# Patient Record
Sex: Female | Born: 1950 | Race: Asian | Hispanic: No | Marital: Married | Smoking: Never smoker
Health system: Southern US, Community
[De-identification: ages and names within clinical notes are randomized; demographics above are authoritative.]

## PROBLEM LIST (undated history)

## (undated) DIAGNOSIS — R0602 Shortness of breath: Secondary | ICD-10-CM

## (undated) DIAGNOSIS — I10 Essential (primary) hypertension: Secondary | ICD-10-CM

---

## 1998-03-22 ENCOUNTER — Other Ambulatory Visit: Admission: RE | Admit: 1998-03-22 | Discharge: 1998-03-22 | Payer: Self-pay | Admitting: Dermatology

## 2011-11-01 ENCOUNTER — Inpatient Hospital Stay (HOSPITAL_COMMUNITY)
Admission: EM | Admit: 2011-11-01 | Discharge: 2011-11-05 | DRG: 066 | Disposition: A | Discharge status: Home / Self Care | Payer: Self-pay | Attending: Neurology | Admitting: Neurology

## 2011-11-01 ENCOUNTER — Other Ambulatory Visit: Payer: Self-pay

## 2011-11-01 ENCOUNTER — Emergency Department (HOSPITAL_COMMUNITY): Payer: Self-pay

## 2011-11-01 ENCOUNTER — Encounter: Payer: Self-pay | Admitting: Physical Medicine and Rehabilitation

## 2011-11-01 DIAGNOSIS — E119 Type 2 diabetes mellitus without complications: Secondary | ICD-10-CM | POA: Diagnosis present

## 2011-11-01 DIAGNOSIS — E785 Hyperlipidemia, unspecified: Secondary | ICD-10-CM | POA: Diagnosis present

## 2011-11-01 DIAGNOSIS — I10 Essential (primary) hypertension: Secondary | ICD-10-CM | POA: Diagnosis present

## 2011-11-01 DIAGNOSIS — Z9119 Patient's noncompliance with other medical treatment and regimen: Secondary | ICD-10-CM

## 2011-11-01 DIAGNOSIS — I619 Nontraumatic intracerebral hemorrhage, unspecified: Principal | ICD-10-CM | POA: Diagnosis present

## 2011-11-01 DIAGNOSIS — Z91199 Patient's noncompliance with other medical treatment and regimen due to unspecified reason: Secondary | ICD-10-CM

## 2011-11-01 HISTORY — DX: Essential (primary) hypertension: I10

## 2011-11-01 HISTORY — DX: Shortness of breath: R06.02

## 2011-11-01 LAB — POCT I-STAT, CHEM 8
BUN: 12 mg/dL (ref 6–23)
Calcium, Ion: 1.19 mmol/L (ref 1.12–1.32)
Creatinine, Ser: 0.7 mg/dL (ref 0.50–1.10)
TCO2: 29 mmol/L (ref 0–100)

## 2011-11-01 LAB — DIFFERENTIAL
Basophils Relative: 0 % (ref 0–1)
Lymphocytes Relative: 22 % (ref 12–46)
Monocytes Absolute: 0.9 10*3/uL (ref 0.1–1.0)
Monocytes Relative: 9 % (ref 3–12)
Neutro Abs: 7.4 10*3/uL (ref 1.7–7.7)

## 2011-11-01 LAB — COMPREHENSIVE METABOLIC PANEL
BUN: 12 mg/dL (ref 6–23)
CO2: 27 mEq/L (ref 19–32)
Chloride: 101 mEq/L (ref 96–112)
Creatinine, Ser: 0.62 mg/dL (ref 0.50–1.10)
GFR calc non Af Amer: >90 mL/min (ref >90)
Total Bilirubin: 0.3 mg/dL (ref 0.3–1.2)

## 2011-11-01 LAB — APTT: aPTT: 31 seconds (ref 24–37)

## 2011-11-01 LAB — URINALYSIS, ROUTINE W REFLEX MICROSCOPIC
Glucose, UA: 100 mg/dL — AB
Hgb urine dipstick: NEGATIVE
Leukocytes, UA: NEGATIVE
Nitrite: NEGATIVE
Urobilinogen, UA: 0.2 mg/dL (ref 0.0–1.0)

## 2011-11-01 LAB — CBC
HCT: 41 % (ref 36.0–46.0)
Hemoglobin: 14.2 g/dL (ref 12.0–15.0)
MCHC: 34.6 g/dL (ref 30.0–36.0)
MCV: 87.8 fL (ref 78.0–100.0)

## 2011-11-01 LAB — CK TOTAL AND CKMB (NOT AT ARMC)
CK, MB: 1.6 ng/mL (ref 0.3–4.0)
Total CK: 67 U/L (ref 7–177)

## 2011-11-01 LAB — MRSA PCR SCREENING: MRSA by PCR: NEGATIVE

## 2011-11-01 MED ORDER — ACETAMINOPHEN 325 MG PO TABS
650.0000 mg | ORAL_TABLET | Freq: Four times a day (QID) | ORAL | Status: DC | PRN
  Administered 2011-11-02 – 2011-11-05 (×2): 650 mg via ORAL
  Filled 2011-11-01 (×2): qty 2

## 2011-11-01 MED ORDER — NICARDIPINE HCL IN NACL 20-0.86 MG/200ML-% IV SOLN
5.0000 mg/h | INTRAVENOUS | Status: AC
  Administered 2011-11-01: 5 mg/h via INTRAVENOUS
  Filled 2011-11-01: qty 200

## 2011-11-01 MED ORDER — NICARDIPINE HCL IN NACL 20-0.86 MG/200ML-% IV SOLN
5.0000 mg/h | INTRAVENOUS | Status: DC
Start: 2011-11-01 — End: 2011-11-03
  Filled 2011-11-01 (×2): qty 200

## 2011-11-01 MED ORDER — LABETALOL HCL 5 MG/ML IV SOLN
INTRAVENOUS | Status: AC
  Filled 2011-11-01: qty 4

## 2011-11-01 NOTE — ED Notes (Signed)
Pt was brought in by family, pt was at church singing & the pt. Fell with L sided weakness & L sided facial droop, A&O x4 upon arrival, pt taken to CT scan from triage

## 2011-11-01 NOTE — ED Provider Notes (Signed)
History     CSN: [id number]  Arrival date & time 11/01/11  1235   First MD Initiated Contact with Patient 11/01/11 1243      Chief Complaint  Patient presents with  . Code Stroke    (Consider location/radiation/quality/duration/timing/severity/associated sxs/prior treatment) HPI Patient is a 60 year old female with history of diabetes and hypertension who was at church singing today when she suddenly collapsed. This occurred at 12 PM. Patient had a witnessed collapsed with subsequent left upper and lower extremity weakness. Church member who is also a friend and a Surveyor, mining witness the incident. He transported the patient by car to our emergency department. Upon arrival patient did have right-sided nasolabial fold flattening, left upper and lower limb ataxia, left upper and lower sensory deficits in the extremities. Patient was a code stroke that was called from triage. Initial blood pressure was 196/102. Initial fingerstick was 130. Patient was transported to scanner for further management. Past Medical History  Diagnosis Date  . Diabetes mellitus   . Hypertension     No past surgical history on file.  No family history on file.  History  Substance Use Topics  . Smoking status: Not on file  . Smokeless tobacco: Not on file  . Alcohol Use:     OB History    Grav Para Term Preterm Abortions TAB SAB Ect Mult Living                  Review of Systems  Unable to perform ROS: Other    Allergies  Review of patient's allergies indicates no known allergies.  Home Medications   Current Outpatient Rx  Name Route Sig Dispense Refill  . VITAMIN D-3 PO Oral Take 1 tablet by mouth daily.        BP 196/102  Pulse 82  Resp 22  SpO2 91%  Physical Exam  Nursing note and vitals reviewed. Constitutional: She appears well-developed and well-nourished. No distress.  HENT:  Head: Normocephalic and atraumatic.  Eyes: Conjunctivae and EOM are normal. Pupils are  equal, round, and reactive to light.  Neck: Normal range of motion.  Cardiovascular: Normal rate, regular rhythm, normal heart sounds and intact distal pulses.  Exam reveals no gallop and no friction rub.   No murmur heard. Pulmonary/Chest: Effort normal and breath sounds normal. No respiratory distress. She has no wheezes. She has no rales.  Abdominal: Soft. Bowel sounds are normal. She exhibits no distension. There is no tenderness. There is no rebound and no guarding.  Musculoskeletal: She exhibits no edema.  Neurological: She is alert.       Level of Consciousness (1a. ): Alert, keenly responsive (12/30 1310) LOC Questions (1b. ): Answers one question correctly (12/30 1310) LOC Commands (1c. ): Performs both tasks correctly (12/30 1310) Best Gaze (2. ): Normal (12/30 1310) Visual (3. ): No visual loss (12/30 1310) Facial Palsy (4. ): Minor paralysis (12/30 1310) Motor Arm, Left (5a. ): Drift (12/30 1310) Motor Arm, Right (5b. ): Drift (12/30 1310) Motor Leg, Left (6a. ): No drift (12/30 1310) Motor Leg, Right (6b. ): No drift (12/30 1310) Limb Ataxia (7. ): Present in one limb (12/30 1310) Sensory (8. ): Mild-to-moderate sensory loss, patient feels pinprick is less sharp or is dull on the affected side, or there is a loss of superficial pain with pinprick, but patient is aware of being touched (12/30 1310) Best Language (9. ): No aphasia (12/30 1310) Dysarthria (10. ): Normal (12/30 1310) Inattention/Extinction: Visual/tactile/auditory/spatial/personal inattention (  12/30 1310) Total: 7  (12/30 1310)   Skin: Skin is warm and dry. No rash noted.    ED Course  Procedures (including critical care time)  Date: 11/01/2011  Rate: 73  Rhythm: normal sinus rhythm  QRS Axis: normal  Intervals: normal  ST/T Wave abnormalities: normal  Conduction Disutrbances:none  Narrative Interpretation:   Old EKG Reviewed: none available   Labs Reviewed  CBC - Abnormal; Notable for the  following:    WBC 10.7 (*)    All other components within normal limits  COMPREHENSIVE METABOLIC PANEL - Abnormal; Notable for the following:    Potassium 3.3 (*)    Glucose, Bld 126 (*)    ALT 37 (*)    All other components within normal limits  GLUCOSE, CAPILLARY - Abnormal; Notable for the following:    Glucose-Capillary 130 (*)    All other components within normal limits  POCT I-STAT, CHEM 8 - Abnormal; Notable for the following:    Potassium 3.4 (*)    Glucose, Bld 129 (*)    All other components within normal limits  PROTIME-INR  APTT  DIFFERENTIAL  CK TOTAL AND CKMB  TROPONIN I  I-STAT, CHEM 8  URINALYSIS, ROUTINE W REFLEX MICROSCOPIC   Ct Head Wo Contrast  11/01/2011  *RADIOLOGY REPORT*  Clinical Data: Sudden onset of left-sided weakness.  Code stroke.  CT HEAD WITHOUT CONTRAST  Technique:  Contiguous axial images were obtained from the base of the skull through the vertex without contrast.  Comparison: None.  Findings: The brain stem and cerebellum appear normal.  An acute right thalamic hemorrhage measures 2.3 x 1.4 cm and is associated with mild adjacent vasogenic edema.  Part of this hemorrhage extends along the posterior limb of the internal capsule.  No significant effacement of the third ventricle or hydrocephalus.  No significant midline shift noted.  Mild hypodensity anterior to the hemorrhage could be due to adjacent lacunar infarct or base genic edema.  Periventricular and corona radiata white matter hypodensities are most compatible with chronic ischemic microvascular white matter disease.  No discrete mass lesion is observed.  IMPRESSION:  1.  Acute right thalamic hemorrhage with adjacent vasogenic edema. More confluent hypodensity anterior to this hemorrhage is also probably from vasogenic edema but could be due to an adjacent age indeterminate lacunar infarct.  Critical Value/emergent results were called by telephone to Dr. Earlie Lou (who verbally acknowledged these  results) at the time of interpretation on 11/01/2011 at 1:04 p.m.  Original Report Authenticated By: Dellia Cloud, M.D.   CRITICAL CARE Performed by: Cyndra Numbers   Total critical care time: 30 min  Critical care time was exclusive of separately billable procedures and treating other patients.  Critical care was necessary to treat or prevent imminent or life-threatening deterioration.  Critical care was time spent personally by Natassja on the following activities: development of treatment plan with patient and/or surrogate as well as nursing, discussions with consultants, evaluation of patient's response to treatment, examination of patient, obtaining history from patient or surrogate, ordering and performing treatments and interventions, ordering and review of laboratory studies, ordering and review of radiographic studies, pulse oximetry and re-evaluation of patient's condition.    1. CVA (cerebrovascular accident due to intracerebral hemorrhage)       MDM  Patient was evaluated by myself. She had a fingerstick that was 130. Patient was quickly checked and taken to scanner for evaluation. She had obvious left-sided weakness and sensory deficits. This was sudden in  onset last time seen normal at 12:00. Code stroke was called. However this was not paged correctly. This was quickly recognized and all of code stroke team was called. CT scan showed right thalamic intracerebral hemorrhage. Patient's blood pressure was already beginning to improve on its own. Nicardipine drip was ordered with plan to titrate between systolic of 130 and 150 systolic. Patient's exam remained stable while in the emergency department. Dr. Gershon Cull was present at bedside. Patient will be admitted to the neuro ICU for further management.  I did speak with patient's acquaintances who brought her in today regarding her immediate condition as well as her history.        Cyndra Numbers, MD 11/01/11 [phone]

## 2011-11-01 NOTE — ED Notes (Addendum)
Pt arrived to dept. @ 12:35 through triage, Code Stroke called at 12:38, pt last seen normal at 12:00 today, pt taken to CT at 12:46, neurology  & stroke team at bedside @ 12:44, CT read @12 :55. pt not candidate for TPA due to bleeding.

## 2011-11-01 NOTE — ED Notes (Signed)
Report given to 3100. Pt transported on cardiac monitor. Family at bedside.

## 2011-11-01 NOTE — ED Notes (Signed)
Code stroke cancelled per Dr. Gershon Cull.

## 2011-11-01 NOTE — ED Notes (Signed)
Pts watch, 2 earrings, grey colored necklace with grey cross, & yellow bracelet given to pts husband

## 2011-11-01 NOTE — ED Notes (Signed)
Pt brought to ED with physician friend who states he was sitting with the pt 30 minutes ago when she had sudden L sided weakness which remains now. Pt is unable to lift L arm or grip with L hand.

## 2011-11-01 NOTE — H&P (Signed)
Admission H&P    Chief Complaint: "left hemiparesis" HPI: Carol Alvarez is an 60 y.o. female who was in church singing, then stepped off the stage and developed left sided weakness with resultant fall to the floor. Brought in as CVA code with NIHSS of 7. CT head showed right subcortical hemorrhage, so no t-PA was given.  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   LSN: 12:00 am   tPA Given: No: hemorrhage  MRankin: 0  Past Medical History  Diagnosis Date  . Diabetes mellitus   . Hypertension     No past surgical history on file.  No family history on file. Social History:  does not have a smoking history on file. She does not have any smokeless tobacco history on file. Her alcohol and drug histories not on file.  Allergies: No Known Allergies  Meds: None  ROS: As above  Physical Examination: Blood pressure 196/102, pulse 82, resp. rate 22, SpO2 91.00%.  Neurologic Examination: MS: AAO*3, no aphasia, followed complex commands CN: EOMI, PERRL, VFF, mild left facial droop, tongue midline, V1-V3 decreased sensation on the left, no dysarthria, mild left drift, left proximal UE 5-/5, left distal UE 4+/5, right UE and LE - is 5/5. LLE proximal 4/5, LLE distal 5/5. Sensory: reduced sensation to pinprick in LUE and LLE. Coord: F to N intact b/l, Refelxes: 1+ in UE, trace KJ b/l, 0 ankle jerks b/l, mute plantars b/l, gait: deferred  Results for orders placed during the hospital encounter of 11/01/11 (from the past 48 hour(s))  PROTIME-INR     Status: Normal   Collection Time   11/01/11 12:41 PM      Component Value Range Comment   Prothrombin Time 12.6  11.6 - 15.2 (seconds)    INR 0.92  0.00 - 1.49    APTT     Status: Normal   Collection Time   11/01/11 12:41 PM      Component Value Range Comment   aPTT 31  24 - 37 (seconds)   CBC     Status: Abnormal   Collection Time   11/01/11 12:41 PM      Component Value Range  Comment   WBC 10.7 (*) 4.0 - 10.5 (K/uL)    RBC 4.67  3.87 - 5.11 (MIL/uL)    Hemoglobin 14.2  12.0 - 15.0 (g/dL)    HCT 16.1  09.6 - 04.5 (%)    MCV 87.8  78.0 - 100.0 (fL)    MCH 30.4  26.0 - 34.0 (pg)    MCHC 34.6  30.0 - 36.0 (g/dL)    RDW 40.9  81.1 - 91.4 (%)    Platelets 286  150 - 400 (K/uL)   DIFFERENTIAL     Status: Normal   Collection Time   11/01/11 12:41 PM      Component Value Range Comment   Neutrophils Relative 69  43 - 77 (%)    Neutro Abs 7.4  1.7 - 7.7 (K/uL)    Lymphocytes Relative 22  12 - 46 (%)    Lymphs Abs 2.3  0.7 - 4.0 (K/uL)    Monocytes Relative 9  3 - 12 (%)    Monocytes Absolute 0.9  0.1 - 1.0 (K/uL)    Eosinophils Relative 1  0 - 5 (%)    Eosinophils Absolute 0.1  0.0 - 0.7 (K/uL)    Basophils Relative 0  0 - 1 (%)  Basophils Absolute 0.0  0.0 - 0.1 (K/uL)   COMPREHENSIVE METABOLIC PANEL     Status: Abnormal   Collection Time   11/01/11 12:41 PM      Component Value Range Comment   Sodium 139  135 - 145 (mEq/L)    Potassium 3.3 (*) 3.5 - 5.1 (mEq/L)    Chloride 101  96 - 112 (mEq/L)    CO2 27  19 - 32 (mEq/L)    Glucose, Bld 126 (*) 70 - 99 (mg/dL)    BUN 12  6 - 23 (mg/dL)    Creatinine, Ser 1.61  0.50 - 1.10 (mg/dL)    Calcium 9.7  8.4 - 10.5 (mg/dL)    Total Protein 8.0  6.0 - 8.3 (g/dL)    Albumin 4.0  3.5 - 5.2 (g/dL)    AST 31  0 - 37 (U/L)    ALT 37 (*) 0 - 35 (U/L)    Alkaline Phosphatase 89  39 - 117 (U/L)    Total Bilirubin 0.3  0.3 - 1.2 (mg/dL)    GFR calc non Af Amer >90  >90 (mL/min)    GFR calc Af Amer >90  >90 (mL/min)   CK TOTAL AND CKMB     Status: Normal   Collection Time   11/01/11 12:41 PM      Component Value Range Comment   Total CK 67  7 - 177 (U/L)    CK, MB 1.6  0.3 - 4.0 (ng/mL)    Relative Index RELATIVE INDEX IS INVALID  0.0 - 2.5    TROPONIN I     Status: Normal   Collection Time   11/01/11 12:41 PM      Component Value Range Comment   Troponin I <0.30  <0.30 (ng/mL)   GLUCOSE, CAPILLARY      Status: Abnormal   Collection Time   11/01/11 12:41 PM      Component Value Range Comment   Glucose-Capillary 130 (*) 70 - 99 (mg/dL)   POCT I-STAT, CHEM 8     Status: Abnormal   Collection Time   11/01/11 12:49 PM      Component Value Range Comment   Sodium 143  135 - 145 (mEq/L)    Potassium 3.4 (*) 3.5 - 5.1 (mEq/L)    Chloride 104  96 - 112 (mEq/L)    BUN 12  6 - 23 (mg/dL)    Creatinine, Ser 0.96  0.50 - 1.10 (mg/dL)    Glucose, Bld 045 (*) 70 - 99 (mg/dL)    Calcium, Ion 4.09  1.12 - 1.32 (mmol/L)    TCO2 29  0 - 100 (mmol/L)    Hemoglobin 14.3  12.0 - 15.0 (g/dL)    HCT 81.1  91.4 - 78.2 (%)    Ct Head Wo Contrast  11/01/2011  *RADIOLOGY REPORT*  Clinical Data: Sudden onset of left-sided weakness.  Code stroke.  CT HEAD WITHOUT CONTRAST  Technique:  Contiguous axial images were obtained from the base of the skull through the vertex without contrast.  Comparison: None.  Findings: The brain stem and cerebellum appear normal.  An acute right thalamic hemorrhage measures 2.3 x 1.4 cm and is associated with mild adjacent vasogenic edema.  Part of this hemorrhage extends along the posterior limb of the internal capsule.  No significant effacement of the third ventricle or hydrocephalus.  No significant midline shift noted.  Mild hypodensity anterior to the hemorrhage could be due to adjacent lacunar infarct or base genic  edema.  Periventricular and corona radiata white matter hypodensities are most compatible with chronic ischemic microvascular white matter disease.  No discrete mass lesion is observed.  IMPRESSION:  1.  Acute right thalamic hemorrhage with adjacent vasogenic edema. More confluent hypodensity anterior to this hemorrhage is also probably from vasogenic edema but could be due to an adjacent age indeterminate lacunar infarct.  Critical Value/emergent results were called by telephone to Dr. Earlie Lou (who verbally acknowledged these results) at the time of interpretation on  11/01/2011 at 1:04 p.m.  Original Report Authenticated By: Dellia Cloud, M.D.   Assessment: 60 y.o. female who comes in with acute right subcortical hemorrhagic CVA  Stroke Risk Factors - diabetes mellitus, HTN  Plan: 1. HgbA1c, fasting lipid panel 2. MRI, MRA  of the brain without contrast 3. PT consult, OT consult, Speech consult 4. Echocardiogram 5. Carotid dopplers 6. Prophylactic therapy-None. Hemorrhagic CVA.  7. Risk factor modification 8. Heart healthy diet when passes nursing swallow screen 9. Bed rest 10. Sequential compression device 11. Nicardipine drip for goal SBP of 130 to 150 (closer to 130 is better)   LOS: 0 days   Carol Alvarez

## 2011-11-02 ENCOUNTER — Inpatient Hospital Stay (HOSPITAL_COMMUNITY): Payer: Self-pay

## 2011-11-02 LAB — LIPID PANEL
LDL Cholesterol: 132 mg/dL — ABNORMAL HIGH (ref 0–99)
Triglycerides: 249 mg/dL — ABNORMAL HIGH (ref <150)

## 2011-11-02 LAB — GLUCOSE, CAPILLARY

## 2011-11-02 LAB — HEMOGLOBIN A1C: Hgb A1c MFr Bld: 6.8 % — ABNORMAL HIGH (ref <5.7)

## 2011-11-02 MED ORDER — SIMVASTATIN 20 MG PO TABS
20.0000 mg | ORAL_TABLET | Freq: Every day | ORAL | Status: DC
  Administered 2011-11-02 – 2011-11-05 (×4): 20 mg via ORAL
  Filled 2011-11-02 (×4): qty 1

## 2011-11-02 MED ORDER — LISINOPRIL 5 MG PO TABS
5.0000 mg | ORAL_TABLET | Freq: Every day | ORAL | Status: DC
  Administered 2011-11-02 – 2011-11-05 (×4): 5 mg via ORAL
  Filled 2011-11-02 (×4): qty 1

## 2011-11-02 NOTE — Progress Notes (Signed)
Physical Therapy Evaluation Patient Details Name: Carol Alvarez MRN: [id number] DOB: Sep 10, 1951 Today's Date: 11/02/2011  Problem List: There is no problem list on file for this patient.   Past Medical History:  Past Medical History  Diagnosis Date  . Diabetes mellitus   . Hypertension   . Shortness of breath    Past Surgical History: No past surgical history on file.  PT Assessment/Plan/Recommendation PT Assessment Clinical Impression Statement: Pt s/p Rt basal ganglia hemorrhagic CVA with resultant Lt sided weakness and decr balance.  Pt with decr awareness of deficits and will benefit from continued therapies to incr safety and Independence prior to d/c home with husband's assist. PT Recommendation/Assessment: Patient will need skilled PT in the acute care venue PT Problem List: Decreased strength;Decreased balance;Decreased mobility;Decreased cognition;Decreased knowledge of use of DME;Decreased safety awareness Barriers to Discharge: None PT Therapy Diagnosis : Difficulty walking;Hemiplegia non-dominant side PT Plan PT Frequency: Min 4X/week PT Treatment/Interventions: DME instruction;Gait training;Functional mobility training;Therapeutic activities;Balance training;Neuromuscular re-education;Patient/family education PT Recommendation Recommendations for Other Services: Rehab consult Follow Up Recommendations: Inpatient Rehab Equipment Recommended: Defer to next venue PT Goals  Acute Rehab PT Goals PT Goal Formulation: With patient Time For Goal Achievement: 7 days Pt will Roll Supine to Right Side: with supervision PT Goal: Rolling Supine to Right Side - Progress: Not met Pt will go Supine/Side to Sit: with supervision;with HOB 0 degrees PT Goal: Supine/Side to Sit - Progress: Not met Pt will Sit at [facility] of Bed: with supervision;3-5 min;with no upper extremity support PT Goal: Sit at Edge Of Bed - Progress: Not met Pt will go Sit to Supine/Side: with supervision;with HOB 0  degrees PT Goal: Sit to Supine/Side - Progress: Not met Pt will go Sit to Stand: with supervision PT Goal: Sit to Stand - Progress: Not met Pt will go Stand to Sit: with supervision PT Goal: Stand to Sit - Progress: Not met Pt will Stand: with supervision;3 - 5 min;with no upper extremity support PT Goal: Stand - Progress: Not met Pt will Ambulate: 51 - 150 feet;with supervision;with least restrictive assistive device PT Goal: Ambulate - Progress: Not met  PT Evaluation Precautions/Restrictions  Precautions Precautions: Fall Required Braces or Orthoses: No Restrictions Weight Bearing Restrictions: No Prior Functioning  Home Living Lives With: Spouse (can be there 24 hr) Receives Help From: Family Type of Home: House Home Layout: One level Home Access: Level entry Bathroom Shower/Tub: Walk-in shower;Door [employer] Toilet: Standard Home Adaptive Equipment: Hand-held shower hose;Other (comment) (vanity beside toilet) Prior Function Level of Independence: Independent with basic ADLs;Independent with homemaking with ambulation Driving: Yes 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 Cognition Cognition Arousal/Alertness: Awake/alert Overall Cognitive Status: Impaired Orientation Level: Oriented X4 Awareness of Errors: Decreased awareness of errors made Decreased Awareness of Errors: Assistance required to identify errors made;Assistance required to correct errors made Awareness of Errors - Other Comments: leans to Lt with decr awareness;  Awareness of Deficits: Decreased awareness of deficits Awareness of Deficits - Other Comments: inattention of Lt side Sensation/Coordination Sensation Light Touch: Not tested Coordination Gross Motor Movements are Fluid and Coordinated: No Fine Motor Movements are Fluid and Coordinated: No (slowness of opposition with LUE) Coordination and Movement Description: Slow and delibrate with LUE compared to RUE Finger Nose Finger  Test: Slow and delibrate with LUE compared to RUE Extremity Assessment RUE Assessment RUE Assessment: Within Functional Limits LUE Assessment LUE Assessment: Exceptions to [facility] LUE Strength LUE Overall Strength: Deficits LUE Overall Strength Comments: Pt with slowness  of movements with LUE and not consistently attending to it as well. RLE Assessment RLE Assessment: Within Functional Limits LLE Assessment LLE Assessment: Exceptions to WFL LLE AROM (degrees) Overall AROM Left Lower Extremity: Within functional limits for tasks assessed LLE Strength LLE Overall Strength: Deficits LLE Overall Strength Comments: poor advancement of LLE with initial gait (decr hip flexion, dorsiflexion); improved with practice Mobility (including Balance) Bed Mobility Bed Mobility: Yes Rolling Right: 4: Min assist Rolling Right Details (indicate cue type and reason): vc to bend LLE and reach with LUE to initiate rolling; LUE fell behind her torso as she began to roll with pt unaware; assist required to rotate torso/shoulders to continue onto her side Rolling Left: 5: Supervision Rolling Left Details (indicate cue type and reason): vc for technique and sequencing Right Sidelying to Sit: Other (comment);HOB flat (min-guard assist; visual & vc for sequence) Sitting - Scoot to Edge of Bed: Other (comment) (min-guard assist for safety due to decr balance) Transfers Sit to Stand: 3: Mod assist;Without upper extremity assist;From bed Sit to Stand Details (indicate cue type and reason): leans to Lt with decr awareness; able to initiate correction with verbal and visual cues Stand to Sit: 4: Min assist;With upper extremity assist;To chair/3-in-1 Stand to Sit Details: visual and verbal cues for technique (alignment with chair, use of armrests) Ambulation/Gait Ambulation/Gait: Yes Ambulation/Gait Assistance: 3: Mod assist Ambulation/Gait Assistance Details (indicate cue type and reason): Pt leans to Lt with decr  awareness; narrow base of support with LLE nearly crossing midline on occasion;  Ambulation Distance (Feet): 30 Feet Assistive device: 1 person hand held assist Gait Pattern: Step-through pattern;Decreased stance time - left;Decreased step length - left;Decreased weight shift to right  Posture/Postural Control Posture/Postural Control: Postural limitations Postural Limitations: Leans to Lt with decr awareness Balance Balance Assessed: Yes Static Sitting Balance Static Sitting - Balance Support: Left upper extremity supported;Feet supported Static Sitting - Level of Assistance: 4: Min assist;Other (comment) Static Sitting - Comment/# of Minutes: minguard assist at best; slowly drifts to Lt and does not initiate correction unless cued Static Standing Balance Static Standing - Balance Support: Left upper extremity supported Static Standing - Level of Assistance: 4: Min assist Dynamic Standing Balance Dynamic Standing - Balance Support: Left upper extremity supported Dynamic Standing - Level of Assistance: 3: Mod assist Dynamic Standing - Comments: increasing lean to Lt as activity increased Exercise    End of Session PT - End of Session Equipment Utilized During Treatment: Gait belt Activity Tolerance: Other (comment) (reported dizziness in supine and with changes in position) Patient left: in chair;with call bell in reach Nurse Communication: Mobility status for transfers General Behavior During Session: Flat affect Cognition: Impaired (Decreased attention to left side, says balance is normal)  Britania Shreeve 11/02/2011, 1:02 PM Pager [phone]

## 2011-11-02 NOTE — Plan of Care (Signed)
Problem: Phase II Progression Outcomes Goal: Progress activity as tolerated unless otherwise ordered Outcome: Completed/Met Date Met:  11/02/11 Pt OOB with OT/PT today.  Ignacia Palma, [place] [phone] 11/02/2011

## 2011-11-02 NOTE — Progress Notes (Signed)
  Echocardiogram 2D Echocardiogram has been performed.  Jadan Rouillard Kaylah Chiasson, RDCS 11/02/2011, 3:46 PM

## 2011-11-02 NOTE — Progress Notes (Signed)
Stroke Team Progress Note  SUBJECTIVE Carol Alvarez is an 60 y.o. female who was in church singing, then stepped off the stage and developed left sided weakness with resultant fall to the floor. Brought in as CVA code with NIHSS of 7. CT head showed small right subcortical hemorrhage, so no t-PA was given. 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   Her husband is at the bedside. Overall she feels her condition is gradually improving.   OBJECTIVE Most recent Vital Signs: Temp: 97.7 F (36.5 C) (12/31 0400) Temp src: Oral (12/31 0400) BP: 121/74 mmHg (12/31 0700) Pulse Rate: 65  (12/31 0700) Respiratory Rate: 14 O2 Saturation: 97%  CBG (last 3)   Basename 11/01/11 1241  GLUCAP 130*   Intake/Output from previous day: 12/30 0701 - 12/31 0700 In: 290 [P.O.:60; I.V.:230] Out: 450 [Urine:450]  IV Fluid Intake:     . niCARDipine Stopped (11/01/11 2100)   Scheduled Medications    . labetalol      . niCARDipine  5 mg/hr Intravenous To Major   PRN Medications  acetaminophen  Diet:  Cardiac thin liquids Activity:  bedrest DVT Prophylaxis:  SCDs   Studies: CBC    Component Value Date/Time   WBC 10.7* 11/01/2011 1241   RBC 4.67 11/01/2011 1241   HGB 14.3 11/01/2011 1249   HCT 42.0 11/01/2011 1249   PLT 286 11/01/2011 1241   MCV 87.8 11/01/2011 1241   MCH 30.4 11/01/2011 1241   MCHC 34.6 11/01/2011 1241   RDW 12.6 11/01/2011 1241   LYMPHSABS 2.3 11/01/2011 1241   MONOABS 0.9 11/01/2011 1241   EOSABS 0.1 11/01/2011 1241   BASOSABS 0.0 11/01/2011 1241   CMP    Component Value Date/Time   NA 143 11/01/2011 1249   K 3.4* 11/01/2011 1249   CL 104 11/01/2011 1249   CO2 27 11/01/2011 1241   GLUCOSE 129* 11/01/2011 1249   BUN 12 11/01/2011 1249   CREATININE 0.70 11/01/2011 1249   CALCIUM 9.7 11/01/2011 1241   PROT 8.0 11/01/2011 1241   ALBUMIN 4.0 11/01/2011 1241   AST 31 11/01/2011 1241   ALT 37* 11/01/2011  1241   ALKPHOS 89 11/01/2011 1241   BILITOT 0.3 11/01/2011 1241   GFRNONAA >90 11/01/2011 1241   GFRAA >90 11/01/2011 1241   COAGS Lab Results  Component Value Date   INR 0.92 11/01/2011   Lipid Panel    Component Value Date/Time   CHOL 223* 11/02/2011 0530   TRIG 249* 11/02/2011 0530   HDL 41 11/02/2011 0530   CHOLHDL 5.4 11/02/2011 0530   VLDL 50* 11/02/2011 0530   LDLCALC 132* 11/02/2011 0530   HgbA1C  No results found for this basename: HGBA1C   Urine Drug Screen  No results found for this basename: labopia, cocainscrnur, labbenz, amphetmu, thcu, labbarb    Alcohol Level No results found for this basename: eth   CT of the brain   1.  Acute right thalamic hemorrhage with adjacent vasogenic edema. More confluent hypodensity anterior to this hemorrhage is also probably from vasogenic edema but could be due to an adjacent age indeterminate lacunar infarct.   MRI of the brain  ordered   MRA of the brain  ordered  2D Echocardiogram  ordered  Carotid Doppler  ordered  CXR  Not ordered   EKG  normal sinus rhythm.   Physical Exam    Pleasant middle-aged [place] lady not in distress. Afebrile. Head is  nontraumatic. Neck is supple without bruit. Cardiac exam no murmur or gallop. Lungs clear to auscultation. Abdomen soft nontender. Neurological exam higher mental testing limited due to patient's limited English and absence of any translator. She follows one and two-step commands well. No dysarthria or aphasia. Eye movements are full range without nystagmus. She blinks to threat bilaterally. There is mild left lower facial symmetry. Tongue is midline.  Motor system exam reveals minimum left lower extremity drift. Mild left grip and ankle dorsiflexor weakness. Diminished fine finger movements on the left. Orbits right over Left upper extremity. Mild decreased left hemisensory loss. Coordination is slow but accurate on the left. Deep tendon reflexes are symmetric. Plantars are  both downgoing. Gait was not tested.  ASSESSMENT Ms. Carol Alvarez is a 60 y.o. female with a right basal ganglia hemorrhage, secondary to hypertension.   Stroke risk factors:  diabetes mellitus, hyperlipidemia and hypertension  Hospital day # 1  TREATMENT/PLAN OOB. Stroke workup. Add lisinopril and statin. Check A1c, CBGs, CXR. Keep in ICU today.discussed with patient and husband and answered questions. Maintain strict control of hypertension. DVT and GI prophylaxis. Follow MRI scan. Keeping ICU today and transferred to the floor tomorrow if stableThis patient is critically ill and at significant risk of neurological worsening, death and care requires constant monitoring of vital signs, hemodynamics,respiratory and cardiac monitoring, neurological assessment, discussion with family, other specialists and medical decision making of high complexity. I spent 30 minutes of neurocritical care time  in the care of  this patient.   Joaquin Music, ANP-BC, GNP-BC [facility] Pager: [phone] 11/02/2011 7:49 AM  Dr. Delia Heady, Stroke Center Medical Director, has personally reviewed chart, pertinent data, examined the patient and developed the plan of care.

## 2011-11-02 NOTE — Progress Notes (Signed)
*  PRELIMINARY RESULTS* Carotid Dopplers Performed:  No significant ICA stenosis bilaterally. Vertebral arteries are patent with antegrade flow.  Carol Alvarez 11/02/2011, 6:51 PM

## 2011-11-02 NOTE — Progress Notes (Signed)
Occupational Therapy Evaluation Patient Details Name: Carol Alvarez MRN: [id number] DOB: [birth year] Today's Date: 11/02/2011 11:45-12:18 Co-session with PT Veda Canning)  Problem List: There is no problem list on file for this patient.   Past Medical History:  Past Medical History  Diagnosis Date  . Diabetes mellitus   . Hypertension   . Shortness of breath    Past Surgical History: No past surgical history on file.  OT Assessment/Plan/Recommendation OT Assessment Clinical Impression Statement: This 60 yo female presents with CVA thus affecting pts PLOF at I with BADLs/IADLs. 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 OT Recommendation/Assessment: Patient will need skilled OT in the acute care venue OT Problem List: Decreased strength;Impaired balance (sitting and/or standing);Impaired vision/perception;Decreased safety awareness;Decreased knowledge of use of DME or AE;Impaired UE functional use OT Therapy Diagnosis : Generalized weakness;Hemiplegia dominant side;Cognitive deficits;Disturbance of vision OT Plan OT Frequency: Min 2X/week OT Treatment/Interventions: Self-care/ADL training;Neuromuscular education;Therapeutic exercise;DME and/or AE instruction;Therapeutic activities;Cognitive remediation/compensation;Visual/perceptual remediation/compensation;Patient/family education;Balance training OT Recommendation Follow Up Recommendations: Inpatient Rehab Equipment Recommended: Defer to next venue Individuals Consulted Consulted and Agree with Results and Recommendations: Patient OT Goals Acute Rehab OT Goals OT Goal Formulation: With patient Time For Goal Achievement: 7 days ADL Goals Pt Will Perform Grooming: with set-up;with supervision;Standing at sink;Unsupported ADL Goal: Grooming - Progress: Not met Pt Will Perform Upper Body Bathing: with set-up;with  supervision;Unsupported;Sit to stand from chair;Sit to stand from bed ADL Goal: Upper Body Bathing - Progress: Not addressed Pt Will Perform Lower Body Bathing: with set-up;with supervision;Unsupported;Sit to stand from chair;Sit to stand from bed ADL Goal: Lower Body Bathing - Progress: Not met Pt Will Perform Upper Body Dressing: with set-up;with supervision;Sit to stand from chair;Sit to stand from bed;Unsupported ADL Goal: Upper Body Dressing - Progress: Not met Pt Will Perform Lower Body Dressing: with set-up;Sit to stand from chair;Sit to stand from bed;Unsupported ADL Goal: Lower Body Dressing - Progress: Not met Pt Will Transfer to Toilet: with min assist;Ambulation;3-in-1;Other (comment) (in bathroom) ADL Goal: Toilet Transfer - Progress: Not met Pt Will Perform Toileting - Clothing Manipulation: Independently;Standing ADL Goal: Toileting - Clothing Manipulation - Progress: Not met Pt Will Perform Toileting - Hygiene: Independently;Sit to stand from 3-in-1/toilet ADL Goal: Toileting - Hygiene - Progress: Not met Additional ADL Goal #1: Pt will come up to sit/ sit to supine/sit to stand/ stand to sit regarding LUE with all transfers without cues. ADL Goal: Additional Goal #1 - Progress: Not met Additional ADL Goal #2: Pt will self correct sitting balance without cues. ADL Goal: Additional Goal #2 - Progress: Not met Arm Goals Pt Will Perform AROM: Left upper extremity;1 set;10 reps;Other (comment);Independently (shoudler flex/abd with/without 1# in sitting.) Arm Goal: AROM - Progress: Not met Pt Will Complete Theraputty Exer: Independently;Left upper extremity;Mod resistance putty (handout 3 reps.) Arm Goal: Theraputty Exercises - Progress: Not met Additional Arm Goal #1: Pt will be I with FM activities given from handout. Arm Goal: Additional Goal #1 - Progress: Not met  OT Evaluation Precautions/Restrictions  Precautions Precautions: Fall Required Braces or Orthoses:  No Restrictions Weight Bearing Restrictions: No Prior Functioning Home Living Lives With: Spouse (can be there 24 hr) Receives Help From: Family Type of Home: House Home Layout: One level Home Access: Level entry Bathroom Shower/Tub: Walk-in shower;Door [employer] Toilet: Standard Home Adaptive Equipment: Hand-held shower hose;Other (comment) (vanity beside  toilet) Prior Function Level of Independence: Independent with basic ADLs;Independent with homemaking with ambulation Driving: Yes 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 ADL ADL Eating/Feeding: Simulated;Supervision/safety;Set up Eating/Feeding Details (indicate cue type and reason): Process is very slow when it is a two handed task since, she has decreased use of LUE Where Assessed - Eating/Feeding: Chair Grooming: Simulated;Set up;Supervision/safety Grooming Details (indicate cue type and reason): Process is very slow when it is a two handed task since, she has decreased use of LUE Where Assessed - Grooming: Sitting, chair;Supported Upper Body Bathing: Simulated;Supervision/safety;Set up Upper Body Bathing Details (indicate cue type and reason): Process is very slow when it is a two handed task since, she has decreased use of LUE Where Assessed - Upper Body Bathing: Supported;Sitting, chair Lower Body Bathing: Simulated;Moderate assistance Lower Body Bathing Details (indicate cue type and reason): Mod A needed for sit to stand and standing balance. Where Assessed - Lower Body Bathing: Supported Upper Body Dressing: Simulated;Set up;Supervision/safety Upper Body Dressing Details (indicate cue type and reason): Process is very slow when it is a two handed task since, she has decreased use of LUE Where Assessed - Upper Body Dressing: Sitting, chair;Supported Lower Body Dressing: Simulated;Moderate assistance Lower Body Dressing Details (indicate cue type and reason): Mod A needed for sit to stand and standing  balance. Where Assessed - Lower Body Dressing: Sit to stand from chair;Supported Statistician: Moderate assistance;Simulated Toilet Transfer Details (indicate cue type and reason): Bed (out on right) out door and back to recliner Toilet Transfer Method: Ambulating Toileting - Clothing Manipulation: Simulated;Moderate assistance Toileting - Clothing Manipulation Details (indicate cue type and reason): Mod A needed for sit to stand and standing balance. Where Assessed - Toileting Clothing Manipulation: Other (comment) (sit to stand from chair) Toileting - Hygiene: Simulated;Minimal assistance Toileting - Hygiene Details (indicate cue type and reason): Sit in chair--has a tendency  to lean to the left without self correcting Tub/Shower Transfer: Not assessed Tub/Shower Transfer Method: Not assessed Equipment Used: Other (comment) (gait belt) ADL Comments: Pt with a tendency to lean to left with static and dynamic activites in sitting without regard that she is doing this. Pt not paying attention to LUE consistently Vision/Perception  Vision - History Baseline Vision: Bifocals Patient Visual Report: No change from baseline Vision - Assessment Eye Alignment: Within Functional Limits Vision Assessment: Vision tested Ocular Range of Motion: Within Functional Limits Alignment/Gaze Preference: Within Defined Limits Tracking/Visual Pursuits: Decreased smoothness of eye movement to RIGHT superior field;Decreased smoothness of eye movement to RIGHT inferior field Saccades: Within functional limits Convergence: Within functional limits Visual Fields: No apparent deficits Cognition Cognition Arousal/Alertness: Awake/alert Overall Cognitive Status: Impaired Orientation Level: Oriented X4 Awareness of Errors: Decreased awareness of errors made Decreased Awareness of Errors: Assistance required to identify errors made;Assistance required to correct errors made Awareness of Errors - Other  Comments: leans to Lt with decr awareness;  Awareness of Deficits: Decreased awareness of deficits Awareness of Deficits - Other Comments: inattention of Lt side Sensation/Coordination Sensation Light Touch: Not tested Coordination Gross Motor Movements are Fluid and Coordinated: No Fine Motor Movements are Fluid and Coordinated: No (slowness of opposition with LUE) Coordination and Movement Description: Slow and delibrate with LUE compared to RUE Finger Nose Finger Test: Slow and delibrate with LUE compared to RUE Extremity Assessment RUE Assessment RUE Assessment: Within Functional Limits LUE Assessment LUE Assessment: Exceptions to [facility] LUE Strength LUE Overall Strength: Deficits LUE Overall Strength Comments: Pt with slowness of movements with  LUE and not consistently attending to it as well. Mobility  Bed Mobility Bed Mobility: Yes Rolling Right: 4: Min assist Rolling Right Details (indicate cue type and reason): vc to bend LLE and reach with LUE to initiate rolling; LUE fell behind her torso as she began to roll with pt unaware; assist required to rotate torso/shoulders to continue onto her side Rolling Left: 5: Supervision Rolling Left Details (indicate cue type and reason): vc for technique and sequencing Right Sidelying to Sit: Other (comment);HOB flat (min-guard assist; visual & vc for sequence) Sitting - Scoot to Edge of Bed: Other (comment) (min-guard assist for safety due to decr balance) Transfers Sit to Stand: 3: Mod assist;Without upper extremity assist;From bed Sit to Stand Details (indicate cue type and reason): leans to Lt with decr awareness; able to initiate correction with verbal and visual cues Stand to Sit: 4: Min assist;With upper extremity assist;To chair/3-in-1 Stand to Sit Details: visual and verbal cues for technique (alignment with chair, use of armrests) Exercises   End of Session OT - End of Session Equipment Utilized During Treatment: Gait  belt Activity Tolerance: Patient tolerated treatment well Patient left: in chair;with call bell in reach;Other (comment) (cell phone within reach) General Behavior During Session: Flat affect Cognition: Impaired (Decreased attention to left side, says balance is normal)   Evette Georges [phone] 11/02/2011, 12:53 PM

## 2011-11-03 ENCOUNTER — Encounter (HOSPITAL_COMMUNITY): Payer: Self-pay | Admitting: *Deleted

## 2011-11-03 NOTE — Progress Notes (Signed)
Speech Language/Pathology Speech Language Pathology Evaluation   SLP Assessment/Plan/Recommendation  Clinical Impression: 61 year old female admitted with acute right basal ganglia CVA.  Pt's first language is [place] but she speaks and understands English sufficiently such that no interpretor was needed. Pt is good historian; displays adequate short-term memory for events of last few days.  Overall, cognition appears to be WNL.  No further SLP intervention is warranted.   SLP Recommendation/Assessment: Patient does not need any further Speech Language Pathology Services No Skilled Speech Therapy: Patient at baseline level of functioning  Consulted and Agree with Results and Recommendations: Patient  SLP Goals n/a   Carol Alvarez L. Samson Frederic, [place] CCC/SLP Pager [phone]  Carol Alvarez 11/03/2011, 12:40 PM

## 2011-11-03 NOTE — Progress Notes (Signed)
Stroke Team Progress Note  SUBJECTIVE Carol Alvarez is an 61 y.o. female who was in church singing, then stepped off the stage and developed left sided weakness with resultant fall to the floor. Brought in as CVA code with NIHSS of 7. CT head showed small right subcortical hemorrhage, so no t-PA was given. 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   She has remained stable overnight.. Overall she feels her condition is gradually improving. She yet has dizziness but it is much less.she was evaluated by physical and occupational therapy and inpatient rehabilitation has been recommended. 2-D echocardiogram was unremarkable. Carotid Dopplers showed no significant stenosis. MRI scan of the brain showed small right thalamic hemorrhage without interventricular extension or any underlying masses.Blood pressure has been easily controlled  OBJECTIVE Most recent Vital Signs: Temp: 97.6 F (36.4 C) (01/01 0400) Temp src: Oral (01/01 0400) BP: 128/92 mmHg (01/01 0900) Pulse Rate: 77  (01/01 0900) Respiratory Rate: 18 O2 Saturation: 97%  CBG (last 3)   Basename 11/02/11 1624 11/02/11 1250 11/02/11 0751  GLUCAP 162* 136* 132*   Intake/Output from previous day: 12/31 0701 - 01/01 0700 In: 360 [P.O.:360] Out: 250 [Urine:250]  IV Fluid Intake:      . niCARDipine Stopped (11/01/11 2100)   Scheduled Medications    . lisinopril  5 mg Oral Daily  . simvastatin  20 mg Oral q1800   PRN Medications  acetaminophen  Diet:  Cardiac thin liquids Activity:  bedrest DVT Prophylaxis:  SCDs   Studies: CBC    Component Value Date/Time   WBC 10.7* 11/01/2011 1241   RBC 4.67 11/01/2011 1241   HGB 14.3 11/01/2011 1249   HCT 42.0 11/01/2011 1249   PLT 286 11/01/2011 1241   MCV 87.8 11/01/2011 1241   MCH 30.4 11/01/2011 1241   MCHC 34.6 11/01/2011 1241   RDW 12.6 11/01/2011 1241   LYMPHSABS 2.3 11/01/2011 1241   MONOABS 0.9 11/01/2011 1241   EOSABS 0.1 11/01/2011 1241   BASOSABS 0.0 11/01/2011 1241   CMP    Component Value Date/Time   NA 143 11/01/2011 1249   K 3.4* 11/01/2011 1249   CL 104 11/01/2011 1249   CO2 27 11/01/2011 1241   GLUCOSE 129* 11/01/2011 1249   BUN 12 11/01/2011 1249   CREATININE 0.70 11/01/2011 1249   CALCIUM 9.7 11/01/2011 1241   PROT 8.0 11/01/2011 1241   ALBUMIN 4.0 11/01/2011 1241   AST 31 11/01/2011 1241   ALT 37* 11/01/2011 1241   ALKPHOS 89 11/01/2011 1241   BILITOT 0.3 11/01/2011 1241   GFRNONAA >90 11/01/2011 1241   GFRAA >90 11/01/2011 1241   COAGS Lab Results  Component Value Date   INR 0.92 11/01/2011   Lipid Panel    Component Value Date/Time   CHOL 223* 11/02/2011 0530   TRIG 249* 11/02/2011 0530   HDL 41 11/02/2011 0530   CHOLHDL 5.4 11/02/2011 0530   VLDL 50* 11/02/2011 0530   LDLCALC 132* 11/02/2011 0530   HgbA1C  Lab Results  Component Value Date   HGBA1C 6.8* 11/02/2011   Urine Drug Screen  No results found for this basename: labopia,  cocainscrnur,  labbenz,  amphetmu,  thcu,  labbarb    Alcohol Level No results found for this basename: eth   CT of the brain   1.  Acute right thalamic hemorrhage with adjacent vasogenic edema. More confluent hypodensity anterior to this hemorrhage is also probably from vasogenic edema  but could be due to an adjacent age indeterminate lacunar infarct.   MRI of the brain  Small right thalamic hemorrhage without intraventricular extension or underlying masses noted. MRA of the brain No large vessel stenosis noted 2D Echocardiogram normal ejection fraction without cardiac source of embolism Carotid Doppler preliminary results suggest no significant extracranial stenosis CXR  Not ordered  Lipids are elevated EKG  normal sinus rhythm.   Physical Exam    Pleasant middle-aged [place] lady not in distress. Afebrile. Head is nontraumatic. Neck is supple without bruit. Cardiac exam no murmur or gallop. Lungs clear to auscultation.  Abdomen soft nontender. Neurological exam  patient's limited English and absence of any translator limits detailed cognitive testing. She follows one and two-step commands well. No dysarthria or aphasia. Eye movements are full range without nystagmus. She blinks to threat bilaterally. There is mild left lower facial symmetry. Tongue is midline.  Motor system exam reveals minimum left lower extremity drift. Mild left grip and ankle dorsiflexor weakness. Diminished fine finger movements on the left. Orbits right over Left upper extremity. Mild decreased left hemisensory loss. Coordination is slow but accurate on the left. Deep tendon reflexes are symmetric. Plantars are both downgoing. Gait was not tested.  ASSESSMENT Ms. Carol Alvarez is a 61 y.o. female with a right basal ganglia hemorrhage, secondary to hypertension.   Stroke risk factors:  diabetes mellitus, hyperlipidemia and hypertension  Hospital day # 2  TREATMENT/PLAN OOB.  . Maintain strict control of hypertension. DVT and GI prophylaxis.   Transferred to the floor today .inpatient rehabilitation consult.Address hyperlipidemia at discharge Delia Heady, M.D. [facility] Pager: [phone] 11/03/2011 10:23 AM

## 2011-11-03 NOTE — Progress Notes (Signed)
Physical Therapy Treatment Patient Details Name: Carol Alvarez MRN: [id number] DOB: 13-Jan-1951 Today's Date: 11/03/2011  PT Assessment/Plan  PT - Assessment/Plan PT Plan: Discharge plan remains appropriate PT Frequency: Min 4X/week Follow Up Recommendations: Inpatient Rehab Equipment Recommended: Defer to next venue PT Goals  Acute Rehab PT Goals PT Goal: Rolling Supine to Right Side - Progress: Progressing toward goal PT Goal: Supine/Side to Sit - Progress: Progressing toward goal PT Goal: Sit at Edge Of Bed - Progress: Progressing toward goal PT Goal: Sit to Stand - Progress: Progressing toward goal PT Goal: Stand to Sit - Progress: Progressing toward goal PT Goal: Ambulate - Progress: Progressing toward goal  PT Treatment Precautions/Restrictions  Precautions Precautions: Fall Required Braces or Orthoses: No Restrictions Weight Bearing Restrictions: No Mobility (including Balance) Bed Mobility Rolling Right: Other (comment) (Min Guard (A)) Rolling Right Details (indicate cue type and reason): Cues for sequencing & technique.   Right Sidelying to Sit: Other (comment);HOB flat (Min Guard (A)) Right Sidelying to Sit Details (indicate cue type and reason): Cues for increased use of LUE, technique, sequencing.   Sitting - Scoot to [place] of Bed: Other (comment) (Min Guard (A)) Sitting - Scoot to [place] of Bed Details (indicate cue type and reason): cues for technique & increased use of LUE Transfers Sit to Stand: 4: Min assist;From bed;With upper extremity assist;From toilet Sit to Stand Details (indicate cue type and reason): (A) for balance & safety; cues for hand placement Stand to Sit: 4: Min assist;With upper extremity assist;To toilet;To chair/3-in-1 Stand to Sit Details: (A) for controlled descent & safety; cues for hand placement & body positioning before sitting Ambulation/Gait Ambulation/Gait Assistance: 3: Mod assist Ambulation/Gait Assistance Details (indicate cue type and  reason): (A) for balance, lateral wt shifting.  Cued pt to keep crack of tile between feet to promote increased step/BOS width due to pt with narrow BOS & near or over midline during ambulation.  Ambulation Distance (Feet): 60 Feet Assistive device: 1 person hand held assist (HHA provided on Right side) Gait Pattern: Step-through pattern;Decreased stance time - left;Decreased step length - left (decreased floor clearance & narrow BOS) Stairs: No Wheelchair Mobility Wheelchair Mobility: No  Static Sitting Balance Static Sitting - Balance Support: Bilateral upper extremity supported;Feet supported;Right upper extremity supported;Left upper extremity supported Static Sitting - Level of Assistance: 5: Stand by assistance Static Sitting - Comment/# of Minutes: Cues for upright posture & hand placement to provide increased stability.  Pt required Min (A) to lateral wt shift back towards right side after leaning down onto left elbow to remove gown from underneath right hip.   Exercise  General Exercises - Lower Extremity Ankle Circles/Pumps: AROM;Both;10 reps;Supine Quad Sets: Strengthening;Both;10 reps;Supine Gluteal Sets: Strengthening;Both;Supine;10 reps Long Arc Quad: Strengthening;Both;10 reps;Seated Straight Leg Raises: Strengthening;Both;10 reps;Supine End of Session PT - End of Session Equipment Utilized During Treatment: Gait belt Activity Tolerance: Patient tolerated treatment well Patient left: in chair;with call bell in reach General Behavior During Session: [facility] for tasks performed Cognition: Impaired (decreased attention to left side)  Lara Mulch 11/03/2011, 3:07 PM [phone]

## 2011-11-04 DIAGNOSIS — I619 Nontraumatic intracerebral hemorrhage, unspecified: Secondary | ICD-10-CM

## 2011-11-04 LAB — GLUCOSE, CAPILLARY
Glucose-Capillary: 174 mg/dL — ABNORMAL HIGH (ref 70–99)
Glucose-Capillary: 125 mg/dL — ABNORMAL HIGH (ref 70–99)

## 2011-11-04 NOTE — Consult Note (Signed)
Physical Medicine and Rehabilitation Consult Reason for Consult: Cerebrovascular accident Referring Phsyician: Carol Alvarez Carol Alvarez is an 61 y.o. female.   HPI: 61 year old right-handed [place] female admitted December 30 with left-sided weakness and resultant fall to the floor. Cranial CT scan showed acute right thalamic hemorrhage with adjacent vasogenic edema. MRI of the brain showed small right thalamic hemorrhage without intraventricular extension or underlying mass. Echocardiogram with normal ejection fraction without cardiac source of embolism. Carotid Dopplers with no significant internal carotid artery stenosis. Patient maintained on intravenous Cardene drip for control of blood pressure. Latest physical therapy note on January 1was moderate assist to ambulate 60 feet with hand-held assistance. Requires minimal assist for sit to stand. Speech therapy notes patient to be good historian displays adequate short-term memory for events of last few days. [place] is her primary language but she speaks and understands English well. No further speech therapy was indicated. Physical medicine and rehabilitation was consulted at the request of physical therapy to consider inpatient rehabilitation services  Review of Systems  Constitutional: Positive for malaise/fatigue.  Eyes: Negative for double vision.  Respiratory: Negative for cough and shortness of breath.   Cardiovascular: Negative for chest pain.  Gastrointestinal: Negative for nausea.  Genitourinary: Negative for dysuria.  Musculoskeletal: Negative for myalgias.  Skin: Negative.   Neurological: Positive for dizziness. Negative for headaches.  Psychiatric/Behavioral: Negative.    Past Medical History  Diagnosis Date  . Diabetes mellitus   . Hypertension   . Shortness of breath    No past surgical history on file. No family history on file. Social History:  reports that she has never smoked. She does not have any smokeless tobacco  history on file. She reports that she does not drink alcohol. Her drug history not on file. Allergies: No Known Allergies Medications Prior to Admission  Medication Dose Route Frequency Provider Last Rate Last Dose  . acetaminophen (TYLENOL) tablet 650 mg  650 mg Oral Q6H PRN Carol Alvarez   650 mg at 11/02/11 0002  . labetalol (NORMODYNE,TRANDATE) 5 MG/ML injection           . lisinopril (PRINIVIL,ZESTRIL) tablet 5 mg  5 mg Oral Daily Carol Main, NP   5 mg at 11/03/11 0923  . niCARdipine (CARDENE-IV) infusion (0.1 mg/ml)  5 mg/hr Intravenous To Carol Alvarez 50 mL/hr at 11/01/11 1351 5 mg/hr at 11/01/11 1351  . simvastatin (ZOCOR) tablet 20 mg  20 mg Oral q1800 Carol Main, NP   20 mg at 11/03/11 1822  . DISCONTD: niCARdipine (CARDENE-IV) infusion (0.1 mg/ml)  5 mg/hr Intravenous Continuous Carol Alvarez   2 mg/hr at 11/01/11 2000   No current outpatient prescriptions on file as of 11/04/2011.    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   Rolling Left: 5: Supervision Rolling Left Details (indicate cue type and reason): vc for technique and sequencing Right Sidelying to Sit: Other (comment);HOB flat (Min Guard (A)) Right Sidelying to Sit Details (  indicate cue type and reason): Cues for increased use of LUE, technique,  sequencing.   Sitting - Scoot to [place] of Bed: Other (comment) (Min Guard (A)) Sitting - Scoot to [place] of Bed Details (indicate cue type and reason): cues for technique & increased use of LUE Transfers Sit to Stand: 4: Min assist;From bed;With upper extremity assist;From toilet Sit to Stand Details (indicate cue type and reason): (A) for balance & safety; cues for hand placement Stand to Sit: 4: Min assist;With upper extremity assist;To toilet;To chair/3-in-1 Stand to Sit Details: (A) for controlled descent & safety; cues for hand placement & body positioning before sitting Ambulation/Gait Ambulation/Gait: Yes Ambulation/Gait Assistance: 3: Mod assist Ambulation/Gait Assistance Details (indicate cue type and reason): (A) for balance, lateral wt shifting.  Cued pt to keep crack of tile between feet to promote increased step/BOS width due to pt with narrow BOS & near or over midline during ambulation.  Ambulation Distance (Feet): 60 Feet Assistive device: 1 person hand held assist (HHA provided on Right side) Gait Pattern: Step-through pattern;Decreased stance time - left;Decreased step length - left (decreased floor clearance & narrow BOS) Stairs: No Wheelchair Mobility Wheelchair Mobility: No  ADL: ADL Eating/Feeding: Simulated;Supervision/safety;Set up Eating/Feeding Details (indicate cue type and reason): Process is very slow when it is a two handed task since, she has decreased use of LUE Where Assessed - Eating/Feeding: Chair Grooming: Simulated;Set up;Supervision/safety Grooming Details (indicate cue type and reason): Process is very slow when it is a two handed task since, she has decreased use of LUE Where Assessed - Grooming: Sitting, chair;Supported Upper Body Bathing: Simulated;Supervision/safety;Set up Upper Body Bathing Details (indicate cue type and reason): Process is very slow when it is a two handed task since, she has decreased use of LUE Where Assessed - Upper Body  Bathing: Supported;Sitting, chair Lower Body Bathing: Simulated;Moderate assistance Lower Body Bathing Details (indicate cue type and reason): Mod A needed for sit to stand and standing balance. Where Assessed - Lower Body Bathing: Supported Upper Body Dressing: Simulated;Set up;Supervision/safety Upper Body Dressing Details (indicate cue type and reason): Process is very slow when it is a two handed task since, she has decreased use of LUE Where Assessed - Upper Body Dressing: Sitting, chair;Supported Lower Body Dressing: Simulated;Moderate assistance Lower Body Dressing Details (indicate cue type and reason): Mod A needed for sit to stand and standing balance. Where Assessed - Lower Body Dressing: Sit to stand from chair;Supported Statistician: Moderate assistance;Simulated Toilet Transfer Details (indicate cue type and reason): Bed (out on right) out door and back to recliner Toilet Transfer Method: Ambulating Toileting - Clothing Manipulation: Simulated;Moderate assistance Toileting - Clothing Manipulation Details (indicate cue type and reason): Mod A needed for sit to stand and standing balance. Where Assessed - Toileting Clothing Manipulation: Other (comment) (sit to stand from chair) Toileting - Hygiene: Simulated;Minimal assistance Toileting - Hygiene Details (indicate cue type and reason): Sit in chair--has a tendency  to lean to the left without self correcting Tub/Shower Transfer: Not assessed Tub/Shower Transfer Method: Not assessed Equipment Used: Other (comment) (gait belt) ADL Comments: Pt with a tendency to lean to left with static and dynamic activites in sitting without regard that she is doing this. Pt not paying attention to LUE consistently  Cognition: Cognition Overall Cognitive Status: Appears within functional limits for tasks assessed Arousal/Alertness: Awake/alert Orientation Level: Oriented X4 Attention: Selective Selective Attention: Appears intact Memory:  Appears intact Awareness: Appears intact Problem Solving: Appears intact Executive Function: Initiating Initiating: Appears intact  Comments: Pt presents with functional cognition Cognition Arousal/Alertness: Awake/alert Overall Cognitive Status: Impaired Orientation Level: Oriented X4 Awareness of Errors: Decreased awareness of errors made Decreased Awareness of Errors: Assistance required to identify errors made;Assistance required to correct errors made Awareness of Errors - Other Comments: leans to Lt with decr awareness;  Awareness of Deficits: Decreased awareness of deficits Awareness of Deficits - Other Comments: inattention of Lt side  Blood pressure 119/78, pulse 71, temperature 97.7 F (36.5 C), temperature source Oral, resp. rate 16, height 5\' 2"  (1.575 m), weight 54.7 kg (120 lb 9.5 oz), SpO2 94.00%. Physical Exam  Constitutional: She is oriented to person, place, and time. She appears well-developed.  HENT:  Head: Normocephalic.  Neck: Normal range of motion. Neck supple. No thyromegaly present.  Cardiovascular: Normal rate and regular rhythm.   Pulmonary/Chest: Effort normal and breath sounds normal. She has no wheezes.  Abdominal: She exhibits no distension. There is no tenderness.  Musculoskeletal: She exhibits no edema.  Neurological: She is alert and oriented to person, place, and time.       She is diminished fine motor coordination of the left upper extremity. She has a pronator drift on the left. Strength grossly 4+ out of 5 on the right heme 4 minus to 4/5 on left upper extremity. Lower extremity strength grossly 4-4+ out of 5 bilaterally. Sensory exam grossly intact. No obvious cranial nerve deficits. Cognitively she is quite appropriate.  Skin: Skin is warm and dry.  Psychiatric: She has a normal mood and affect.    Results for orders placed during the hospital encounter of 11/01/11 (from the past 24 hour(s))  GLUCOSE, CAPILLARY     Status: Abnormal    Collection Time   11/03/11  7:23 AM      Component Value Range   Glucose-Capillary 143 (*) 70 - 99 (mg/dL)   Dg Chest 2 View  40/98/1191  *RADIOLOGY REPORT*  Clinical Data: History of stroke. Workup.  CHEST - 2 VIEW  Comparison: None.  Findings: There is moderate enlargement of the cardiac silhouette. Tortuosity and calcifications of the thoracic aorta are evident. No pulmonary edema, pneumonia, or pleural effusion is evident. There is scoliosis convexity to the right in the thoracic spine and convexity to the left in the lumbar spine.  On the lateral image material which is felt to be extrinsic to the patient projects over the chest.  This material was not evident on the AP image.  IMPRESSION: Moderate enlargement of the cardiac silhouette.  No pulmonary edema, pneumonia, or pleural effusion.  Scoliosis.  Original Report Authenticated By: Crawford Givens, M.D.   Mr Angiogram Head Wo Contrast  11/02/2011  *RADIOLOGY REPORT*  Clinical Data:  Sudden onset of left-sided weakness.  Markedly elevated blood pressure on admission.  MRI HEAD WITHOUT CONTRAST MRA HEAD WITHOUT CONTRAST  Technique:  Multiplanar, multiecho pulse sequences of the brain and surrounding structures were obtained without intravenous contrast. Angiographic images of the head were obtained using MRA technique without contrast.  Comparison:  CT head 11/01/2011  MRI HEAD  Findings:  There is an acute, roughly spherical, 19 mm acute hemorrhage affecting the dorsolateral thalamus on the right ( 4 mL volume).  There is a small halo of surrounding edema.  Small hypodensity noted on CT appears to represent an adjacent anteromedial right thalamic remote lacune (image 14 of series 5). This is associated with a small focus of T2 shortening on gradient sequence (image 13 of series 8), which could represent a tiny remote  hemorrhage in the vicinity of this lacune. Similar tiny foci of T2 shortening can be seen in the left inferior cerebellum adjacent to a  lacune, and right paramedian cerebellum (image 6, series 8).  There is moderate premature atrophy for the patient's age of 55. Moderately extensive chronic microvascular ischemic change is seen in the periventricular and subcortical white matter also affecting the brainstem.  There is no significant midline shift.  There is no extra-axial fluid collection.  An empty sella is present with flattened superior aspect of the pituitary.  There is no tonsillar herniation.  The Carol intracranial vascular structures appear patent based on flow void signal.   There is no acute sinus, mastoid, or orbital abnormality.  IMPRESSION: Roughly spherical acute right thalamic hemorrhage.  Given its location, and the degree of chronic microvascular ischemic change, this is likely hypertensive in nature. Subcentimeter foci of chronic hemorrhage can be seen elsewhere in the brain.  Mild atrophy with moderate chronic microvascular ischemic change.  Chronic lacunes of the right thalamus and left cerebellum.  Empty sella.  MRA HEAD  Findings: The internal carotid arteries are widely patent throughout their course.  The basilar artery is widely patent with both vertebrals contributing equally to its formation. These vessels are moderately dolichoectatic, suggesting chronic hypertension.  There is no proximal stenosis of the anterior, middle, or posterior cerebral arteries.  The A1 segment of the right anterior cerebral artery is atretic.  There is no visible intracranial vascular malformation or aneurysm.  The distal MCA and PCA branches appear unremarkable.  There is no visible cerebellar branch occlusion.  IMPRESSION: Unremarkable MR angiography of the intracranial circulation.  Original Report Authenticated By: Elsie Stain, M.D.   Mr Brain Wo Contrast  11/02/2011  *RADIOLOGY REPORT*  Clinical Data:  Sudden onset of left-sided weakness.  Markedly elevated blood pressure on admission.  MRI HEAD WITHOUT CONTRAST MRA HEAD WITHOUT  CONTRAST  Technique:  Multiplanar, multiecho pulse sequences of the brain and surrounding structures were obtained without intravenous contrast. Angiographic images of the head were obtained using MRA technique without contrast.  Comparison:  CT head 11/01/2011  MRI HEAD  Findings:  There is an acute, roughly spherical, 19 mm acute hemorrhage affecting the dorsolateral thalamus on the right ( 4 mL volume).  There is a small halo of surrounding edema.  Small hypodensity noted on CT appears to represent an adjacent anteromedial right thalamic remote lacune (image 14 of series 5). This is associated with a small focus of T2 shortening on gradient sequence (image 13 of series 8), which could represent a tiny remote hemorrhage in the vicinity of this lacune. Similar tiny foci of T2 shortening can be seen in the left inferior cerebellum adjacent to a lacune, and right paramedian cerebellum (image 6, series 8).  There is moderate premature atrophy for the patient's age of 20. Moderately extensive chronic microvascular ischemic change is seen in the periventricular and subcortical white matter also affecting the brainstem.  There is no significant midline shift.  There is no extra-axial fluid collection.  An empty sella is present with flattened superior aspect of the pituitary.  There is no tonsillar herniation.  The Carol intracranial vascular structures appear patent based on flow void signal.   There is no acute sinus, mastoid, or orbital abnormality.  IMPRESSION: Roughly spherical acute right thalamic hemorrhage.  Given its location, and the degree of chronic microvascular ischemic change, this is likely hypertensive in nature. Subcentimeter foci of chronic hemorrhage can be seen  elsewhere in the brain.  Mild atrophy with moderate chronic microvascular ischemic change.  Chronic lacunes of the right thalamus and left cerebellum.  Empty sella.  MRA HEAD  Findings: The internal carotid arteries are widely patent  throughout their course.  The basilar artery is widely patent with both vertebrals contributing equally to its formation. These vessels are moderately dolichoectatic, suggesting chronic hypertension.  There is no proximal stenosis of the anterior, middle, or posterior cerebral arteries.  The A1 segment of the right anterior cerebral artery is atretic.  There is no visible intracranial vascular malformation or aneurysm.  The distal MCA and PCA branches appear unremarkable.  There is no visible cerebellar branch occlusion.  IMPRESSION: Unremarkable MR angiography of the intracranial circulation.  Original Report Authenticated By: Elsie Stain, M.D.    Assessment/Plan: Diagnosis:  right thalamic hemorrhage 1. Does the need for close, 24 hr/day medical supervision in concert with the patient's rehab needs make it unreasonable for this patient to be served in a less intensive setting? No 2. Co-Morbidities requiring supervision/potential complications: HTN, DM 3. Due to bladder management, bowel management, safety, skin/wound care, medication administration, pain management and patient education, does the patient require 24 hr/day rehab nursing? No 4. Does the patient require coordinated care of a physician, rehab nurse, PT (1-2 hrs/day, 5 days/week) and OT (1-2 hrs/day, 5 days/week) to address physical and functional deficits in the context of the above medical diagnosis(es)? NO Addressing deficits in the following areas: balance, bathing, bowel/bladder control, endurance, grooming, locomotion, strength, toileting and transferring 5. Can the patient actively participate in an intensive therapy program of at least 3 hrs of therapy per day at least 5 days per week? Yes 6. The potential for patient to make measurable gains while on inpatient rehab is excellent 7. Anticipated functional outcomes upon discharge from inpatients are modified independent PT, mod I to minimal asssist with OT 8.  Estimated rehab  length of stay to reach the above functional goals is: not applicable 9. Does the patient have adequate social supports to accommodate these discharge functional goals? Yes 10. Anticipated D/C setting: Home 11. Anticipated post D/C treatments: Outpt therapies 12. Overall Rehab/Functional Prognosis: excellent  RECOMMENDATIONS: This patient's condition is appropriate for continued rehabilitative care in the following setting: Outpt PT/OT Patient has agreed to participate in recommended program. Yes Note that insurance prior authorization may be required for reimbursement for recommended care.  Comment: Pt has improved quite a bit over the last 2 days.  Should be appropriate to go home with outpt services.   ZTS 11/04/2011

## 2011-11-04 NOTE — Progress Notes (Cosign Needed)
Stroke Team Progress Note  SUBJECTIVE Carol Alvarez is an 61 y.o. female who was in church singing, then stepped off the stage and developed left sided weakness with resultant fall to the floor. Brought in as CVA code with NIHSS of 7. CT head showed small right subcortical hemorrhage, so no t-PA was given. 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   She has remained stable overnight. Overall she feels her condition is gradually improving. She yet has dizziness but it is much less.she was evaluated by physical and occupational therapy and inpatient rehabilitation has been recommended. 2-D echocardiogram was unremarkable. Carotid Dopplers showed no significant stenosis. MRI scan of the brain showed small right thalamic hemorrhage without interventricular extension or any underlying masses.Blood pressure has been easily controlled  OBJECTIVE Most recent Vital Signs: Temp: 97.8 F (36.6 C) (01/02 0600) BP: 111/77 mmHg (01/02 0600) Pulse Rate: 77  (01/02 0600) Respiratory Rate: 16 O2 Saturation: 93%  CBG (last 3)   Basename 11/04/11 0631 11/03/11 0723 11/02/11 1624  GLUCAP 125* 143* 162*   Intake/Output from previous day: 01/01 0701 - 01/02 0700 In: 540 [P.O.:540] Out: -   IV Fluid Intake:     . DISCONTD: niCARDipine Stopped (11/01/11 2100)   Scheduled Medications   . lisinopril  5 mg Oral Daily  . simvastatin  20 mg Oral q1800   PRN Medications  acetaminophen  Diet:  Cardiac thin liquids Activity:  Up with assistance DVT Prophylaxis:  SCDs   Studies: CBC    Component Value Date/Time   WBC 10.7* 11/01/2011 1241   RBC 4.67 11/01/2011 1241   HGB 14.3 11/01/2011 1249   HCT 42.0 11/01/2011 1249   PLT 286 11/01/2011 1241   MCV 87.8 11/01/2011 1241   MCH 30.4 11/01/2011 1241   MCHC 34.6 11/01/2011 1241   RDW 12.6 11/01/2011 1241   LYMPHSABS 2.3 11/01/2011 1241   MONOABS 0.9 11/01/2011 1241   EOSABS 0.1 11/01/2011  1241   BASOSABS 0.0 11/01/2011 1241   CMP    Component Value Date/Time   NA 143 11/01/2011 1249   K 3.4* 11/01/2011 1249   CL 104 11/01/2011 1249   CO2 27 11/01/2011 1241   GLUCOSE 129* 11/01/2011 1249   BUN 12 11/01/2011 1249   CREATININE 0.70 11/01/2011 1249   CALCIUM 9.7 11/01/2011 1241   PROT 8.0 11/01/2011 1241   ALBUMIN 4.0 11/01/2011 1241   AST 31 11/01/2011 1241   ALT 37* 11/01/2011 1241   ALKPHOS 89 11/01/2011 1241   BILITOT 0.3 11/01/2011 1241   GFRNONAA >90 11/01/2011 1241   GFRAA >90 11/01/2011 1241   COAGS Lab Results  Component Value Date   INR 0.92 11/01/2011   Lipid Panel    Component Value Date/Time   CHOL 223* 11/02/2011 0530   TRIG 249* 11/02/2011 0530   HDL 41 11/02/2011 0530   CHOLHDL 5.4 11/02/2011 0530   VLDL 50* 11/02/2011 0530   LDLCALC 132* 11/02/2011 0530   HgbA1C  Lab Results  Component Value Date   HGBA1C 6.8* 11/02/2011   Urine Drug Screen  No results found for this basename: labopia,  cocainscrnur,  labbenz,  amphetmu,  thcu,  labbarb    Alcohol Level No results found for this basename: eth   CT of the brain   1.  Acute right thalamic hemorrhage with adjacent vasogenic edema. More confluent hypodensity anterior to this hemorrhage is also probably from vasogenic edema but could be  due to an adjacent age indeterminate lacunar infarct.  MRI of the brain  Small right thalamic hemorrhage without intraventricular extension or underlying masses noted. MRA of the brain No large vessel stenosis noted 2D Echocardiogram normal ejection fraction without cardiac source of embolism Carotid Doppler preliminary results suggest no significant extracranial stenosis CXR  Not ordered  Lipids are elevated EKG  normal sinus rhythm.   Physical Exam   Pleasant middle-aged [place] lady not in distress. Afebrile. Head is nontraumatic. Neck is supple without bruit. Cardiac exam no murmur or gallop. Lungs clear to auscultation. Abdomen soft  nontender. Neurological exam  patient's limited English and absence of any translator limits detailed cognitive testing. She follows one and two-step commands well. No dysarthria or aphasia. Eye movements are full range without nystagmus. She blinks to threat bilaterally. There is mild left lower facial symmetry. Tongue is midline.  Motor system exam reveals minimum left lower extremity drift. Mild left grip and ankle dorsiflexor weakness. Diminished fine finger movements on the left. Orbits right over Left upper extremity. Mild decreased left hemisensory loss. Coordination is slow but accurate on the left. Deep tendon reflexes are symmetric. Plantars are both downgoing. Gait was not tested.   ASSESSMENT Ms. Carol Alvarez is a 61 y.o. female with a right basal ganglia hemorrhage, secondary to hypertension. Off antiplatelets secondary to hemorrhage. Needs rehab.  Hypertension, better controlled today, on lisinopril  Hypercholesterolemia, on zocor  Diabetes, new diagnosis  Stroke risk factors:  diabetes mellitus, hyperlipidemia and hypertension  Hospital day # 3  TREATMENT/PLAN Rehab consult pending. Maintain strict control of hypertension. Ongoing risk factor control. Diabetic, low salt diet. Follow up with primary for further diabetes management.  Joaquin Music, ANP-BC, GNP-BC [facility] Pager: [phone] 11/04/2011 9:46 AM  Dr. Delia Heady, Stroke Center Medical Director, has personally reviewed chart, pertinent data, examined the patient and developed the plan of care.

## 2011-11-04 NOTE — Progress Notes (Signed)
Occupational Therapy Treatment Patient Details Name: Alletta Markeria Goetsch MRN: [id number] DOB: 08/14/1951 Today's Date: 11/04/2011 10:25-10:47 Co-session with PT Verdell Face)  OT Assessment/Plan OT Assessment/Plan Comments on Treatment Session: This 61 yo female definitely making progress since eval. LUE is improving as well as balance with deficits "standing out" more as pt becomes fatigued. Feel at this point pt could do OP OT as opposed to initially feeling that she needed inpt rehab. Will continue to follow. OT Plan: Discharge plan needs to be updated OT Frequency: Min 2X/week Follow Up Recommendations: Outpatient OT Equipment Recommended: Tub/shower seat (Pt states she can have her husband get one.) OT Goals ADL Goals ADL Goal: Grooming - Progress: Not addressed ADL Goal: Upper Body Bathing - Progress: Not addressed ADL Goal: Lower Body Bathing - Progress: Not addressed ADL Goal: Upper Body Dressing - Progress: Not addressed ADL Goal: Lower Body Dressing - Progress: Progressing toward goals (Still needs min guard A for sit-stand/stand-sit) ADL Goal: Toilet Transfer - Progress: Met ADL Goal: Toileting - Clothing Manipulation - Progress: Not addressed ADL Goal: Toileting - Hygiene - Progress: Not addressed ADL Goal: Additional Goal #1 - Progress: Not addressed ADL Goal: Additional Goal #2 - Progress: Not addressed Arm Goals Arm Goal: AROM - Progress:  (Not addressed) Arm Goal: Theraputty Exercises - Progress:  (not addressed.) Arm Goal: Additional Goal #1 - Progress: Not addressed  OT Treatment Precautions/Restrictions  Precautions Precautions: Fall Required Braces or Orthoses: No Restrictions Weight Bearing Restrictions: No   ADL ADL Eating/Feeding: Not assessed Grooming: Not assessed Grooming Details (indicate cue type and reason): "I hav already gotten ready this morning" Upper Body Bathing: Not assessed Lower Body Bathing: Not assessed Upper Body Dressing: Not  assessed Lower Body Dressing: Set up Where Assessed - Lower Body Dressing: Unsupported;Sitting, chair Toilet Transfer: Simulated;Other (comment) (min guard A) Toilet Transfer Details (indicate cue type and reason): Chair, hallway around unit, chair Toilet Transfer Method: Ambulating Toileting - Clothing Manipulation: Not assessed Toileting - Hygiene: Not assessed Tub/Shower Transfer: Not assessed Tub/Shower Transfer Method: Ambulating Equipment Used: Rolling walker ADL Comments: Overall pt using her LUE more consistently and increased speed of movement over eval of 11/02/2011. Digit opposition with increased speed over eval of 11/02/2011. Not having issues with RUE at this time. Mobility  Bed Mobility Bed Mobility: No Transfers Transfers: Yes Sit to Stand: 5: Supervision;From chair/3-in-1;With upper extremity assist;With armrests Sit to Stand Details (indicate cue type and reason): cues for hand placement Stand to Sit: To chair/3-in-1;With upper extremity assist;With armrests;5: Supervision Stand to Sit Details: cues for hand placement & body positioning before sitting (to center up with chair Exercises    End of Session OT - End of Session Equipment Utilized During Treatment: Gait belt;Other (comment) (RW) Activity Tolerance: Patient tolerated treatment well Patient left: in chair;with call bell in reach;with family/visitor present (friend per pt) General Behavior During Session: [facility] for tasks performed Cognition: [facility] for tasks performed  Evette Georges [phone] 11/04/2011, 12:42 PM

## 2011-11-04 NOTE — Progress Notes (Signed)
Agree with change in discharge plan to OPPT as pt has improved.  11/04/2011 Veda Canning, PT Pager: [phone]

## 2011-11-04 NOTE — Progress Notes (Signed)
Physical Therapy Treatment Patient Details Name: Carol Alvarez MRN: [id number] DOB: 05-Aug-1951 Today's Date: 11/04/2011  PT Assessment/Plan  PT - Assessment/Plan Comments on Treatment Session: Pt is making great improvement.  Ambulated with (S) with use of RW today.  Due to pt's progress with mobility, pt would benefit from Outpatient PT to maximize balance, independence with functional mobility, & safety.   Pt states that her husband has already purchased a RW for home .  Co-session with OT.   PT Plan: Discharge plan needs to be updated PT Frequency: Min 4X/week Follow Up Recommendations: Outpatient PT Equipment Recommended: Tub/shower seat (Pt states she can have her husband get one.) PT Goals  Acute Rehab PT Goals PT Goal: Sit to Stand - Progress: Met PT Goal: Stand to Sit - Progress: Met PT Goal: Ambulate - Progress: Met **Goals will need to be revised if pt demonstrates consistency with performance next session**  PT Treatment Precautions/Restrictions  Precautions Precautions: Fall Required Braces or Orthoses: No Restrictions Weight Bearing Restrictions: No Mobility (including Balance) Bed Mobility Bed Mobility: No Transfers Sit to Stand: 5: Supervision;From chair/3-in-1;With upper extremity assist;With armrests Sit to Stand Details (indicate cue type and reason): cues for hand placement Stand to Sit: To chair/3-in-1;With upper extremity assist;With armrests;5: Supervision Stand to Sit Details: cues for hand placement & body positioning before sitting (to center up with chair Ambulation/Gait Ambulation/Gait Assistance: 5: Supervision;4: Min assist Ambulation/Gait Assistance Details (indicate cue type and reason): (S) with RW- Cues to stay inside RW & for safe use of RW.  Due to pt's improvement with balance, assessed pt without RW & ambulated with HHA/SPC- Required Min (A) with no AD & single point cane/HHA for balance.  Performed various gait challenges such as gait speed  changes, directional changes, Start/Stopping.  Continues to have narrow BOS but unsure if this is possibly baseline.  As distance increased pt required increased cueing for LLE.  Ambulation Distance (Feet): 200 Feet Assistive device: Rolling walker;Straight cane Gait Pattern: Step-through pattern (decreased step height with increased distance. ) Stairs: No Wheelchair Mobility Wheelchair Mobility: No    Exercise    End of Session PT - End of Session Equipment Utilized During Treatment: Gait belt Activity Tolerance: Patient tolerated treatment well Patient left: in chair;with call bell in reach;with family/visitor present General Behavior During Session: [facility] for tasks performed Cognition: [facility] for tasks performed  Lara Mulch 11/04/2011, 12:41 PM [phone]

## 2011-11-05 DIAGNOSIS — I10 Essential (primary) hypertension: Secondary | ICD-10-CM | POA: Diagnosis present

## 2011-11-05 DIAGNOSIS — E785 Hyperlipidemia, unspecified: Secondary | ICD-10-CM | POA: Diagnosis present

## 2011-11-05 DIAGNOSIS — Z91199 Patient's noncompliance with other medical treatment and regimen due to unspecified reason: Secondary | ICD-10-CM

## 2011-11-05 DIAGNOSIS — E119 Type 2 diabetes mellitus without complications: Secondary | ICD-10-CM | POA: Diagnosis present

## 2011-11-05 DIAGNOSIS — Z9119 Patient's noncompliance with other medical treatment and regimen: Secondary | ICD-10-CM

## 2011-11-05 DIAGNOSIS — I619 Nontraumatic intracerebral hemorrhage, unspecified: Secondary | ICD-10-CM | POA: Diagnosis present

## 2011-11-05 LAB — GLUCOSE, CAPILLARY
Glucose-Capillary: 105 mg/dL — ABNORMAL HIGH (ref 70–99)
Glucose-Capillary: 143 mg/dL — ABNORMAL HIGH (ref 70–99)

## 2011-11-05 MED ORDER — SIMVASTATIN 20 MG PO TABS: 20.0000 mg | ORAL_TABLET | Freq: Every day | ORAL | Status: AC

## 2011-11-05 MED ORDER — LISINOPRIL 5 MG PO TABS: 5.0000 mg | ORAL_TABLET | Freq: Every day | ORAL | Status: DC

## 2011-11-05 NOTE — Progress Notes (Signed)
Stroke Team Progress Note  SUBJECTIVE Carol Alvarez is an 61 y.o. female who was in church singing, then stepped off the stage and developed left sided weakness with resultant fall to the floor. Brought in as CVA code with NIHSS of 7. CT head showed small right subcortical hemorrhage, so no t-PA was given. 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   She has remained stable overnight. Overall she feels her condition is gradually improving. Rehab feels she is too high level for inpatient rehab. Patient wants to go home. Female friend/family member is at the bedside.   OBJECTIVE Most recent Vital Signs: Temp: 97.8 F (36.6 C) (01/03 1000) Temp src: Oral (01/03 1000) BP: 122/83 mmHg (01/03 1000) Pulse Rate: 73  (01/03 1000) Respiratory Rate: 20 O2 Saturation: 97%  CBG (last 3)   Basename 11/05/11 1130 11/05/11 0633 11/04/11 2305  GLUCAP 105* 115* 183*   Intake/Output from previous day: 01/02 0701 - 01/03 0700 In: 840 [P.O.:840] Out: -   IV Fluid Intake:    Scheduled Medications   . lisinopril  5 mg Oral Daily  . simvastatin  20 mg Oral q1800  PRN Medications  acetaminophen  Diet:  Carb Control thin liquids Activity:  Up with assistance DVT Prophylaxis:  SCDs   Studies: CBC    Component Value Date/Time   WBC 10.7* 11/01/2011 1241   RBC 4.67 11/01/2011 1241   HGB 14.3 11/01/2011 1249   HCT 42.0 11/01/2011 1249   PLT 286 11/01/2011 1241   MCV 87.8 11/01/2011 1241   MCH 30.4 11/01/2011 1241   MCHC 34.6 11/01/2011 1241   RDW 12.6 11/01/2011 1241   LYMPHSABS 2.3 11/01/2011 1241   MONOABS 0.9 11/01/2011 1241   EOSABS 0.1 11/01/2011 1241   BASOSABS 0.0 11/01/2011 1241   CMP    Component Value Date/Time   NA 143 11/01/2011 1249   K 3.4* 11/01/2011 1249   CL 104 11/01/2011 1249   CO2 27 11/01/2011 1241   GLUCOSE 129* 11/01/2011 1249   BUN 12 11/01/2011 1249   CREATININE 0.70 11/01/2011 1249   CALCIUM 9.7  11/01/2011 1241   PROT 8.0 11/01/2011 1241   ALBUMIN 4.0 11/01/2011 1241   AST 31 11/01/2011 1241   ALT 37* 11/01/2011 1241   ALKPHOS 89 11/01/2011 1241   BILITOT 0.3 11/01/2011 1241   GFRNONAA >90 11/01/2011 1241   GFRAA >90 11/01/2011 1241   COAGS Lab Results  Component Value Date   INR 0.92 11/01/2011   Lipid Panel    Component Value Date/Time   CHOL 223* 11/02/2011 0530   TRIG 249* 11/02/2011 0530   HDL 41 11/02/2011 0530   CHOLHDL 5.4 11/02/2011 0530   VLDL 50* 11/02/2011 0530   LDLCALC 132* 11/02/2011 0530   HgbA1C  Lab Results  Component Value Date   HGBA1C 6.8* 11/02/2011   Urine Drug Screen  No results found for this basename: labopia,  cocainscrnur,  labbenz,  amphetmu,  thcu,  labbarb    Alcohol Level No results found for this basename: eth   CT of the brain   1.  Acute right thalamic hemorrhage with adjacent vasogenic edema. More confluent hypodensity anterior to this hemorrhage is also probably from vasogenic edema but could be due to an adjacent age indeterminate lacunar infarct.  MRI of the brain  Small right thalamic hemorrhage without intraventricular extension or underlying masses noted. MRA of the brain No large vessel stenosis noted 2D  Echocardiogram normal ejection fraction without cardiac source of embolism Carotid Doppler preliminary results suggest no significant extracranial stenosis CXR  Not ordered  Lipids are elevated EKG  normal sinus rhythm.   Physical Exam   Pleasant middle-aged [place] lady not in distress. Afebrile. Head is nontraumatic. Neck is supple without bruit. Cardiac exam no murmur or gallop. Lungs clear to auscultation. Abdomen soft nontender. Neurological exam  patient's limited English and absence of any translator limits detailed cognitive testing. She follows one and two-step commands well. No dysarthria or aphasia. Eye movements are full range without nystagmus. She blinks to threat bilaterally. There is mild left lower  facial symmetry. Tongue is midline.  Motor system exam reveals minimum left lower extremity drift. Mild left grip and ankle dorsiflexor weakness. Diminished fine finger movements on the left. Orbits right over Left upper extremity. Mild decreased left hemisensory loss. Coordination is slow but accurate on the left. Deep tendon reflexes are symmetric. Plantars are both downgoing. Gait was not tested.   ASSESSMENT Carol Alvarez is a 61 y.o. female with a right basal ganglia hemorrhage, secondary to hypertension. Off antiplatelets secondary to hemorrhage. Rehab recommends OP therapies. Patient stable for discharge today.  Hypertension, better controlled today, on lisinopril  Hypercholesterolemia, on zocor  Diabetes, new diagnosis  Stroke risk factors:  diabetes mellitus, hyperlipidemia and hypertension  Hospital day # 4  TREATMENT/PLAN Discharge home.  Out patient PT and OT. Maintain strict control of blood pressure. Ongoing risk factor control. Diabetic, low salt diet. Follow up with primary for further diabetes management.  Joaquin Music, ANP-BC, GNP-BC [facility] Pager: [phone] 11/05/2011 11:43 AM  Dr. Delia Heady, Stroke Center Medical Director, has personally reviewed chart, pertinent data, examined the patient and developed the plan of care.

## 2011-11-05 NOTE — Discharge Summary (Signed)
Stroke Discharge Summary  Patient ID: Carol Alvarez   MRN: [id number]      DOB: 1951/01/13  Date of Admission: 11/01/2011 Date of Discharge: 11/05/2011  Attending Physician:  Carol Cheshire, MD  Consulting Physician(s):     none  Patient's PCP:  No primary provider on file.  Discharge Diagnoses:  Active Problems:  Right thalamic intracerebral hemorrhage secondary to hypertensive urgency  Hypertension  Diabetes mellitus  Hyperlipidemia  Noncompliance  Past Medical History  Diagnosis Date  . Diabetes mellitus   . Hypertension   . Shortness of breath    No past surgical history on file.  BMI: Body mass index is 22.06 kg/(m^2).   Current Discharge Medication List    START taking these medications   Details  lisinopril (PRINIVIL,ZESTRIL) 5 MG tablet Take 1 tablet (5 mg total) by mouth daily. Qty: 30 tablet, Refills: 2    simvastatin (ZOCOR) 20 MG tablet Take 1 tablet (20 mg total) by mouth daily at 6 PM. Qty: 30 tablet, Refills: 2      CONTINUE these medications which have NOT CHANGED   Details  Cholecalciferol (VITAMIN D-3 PO) Take 1 tablet by mouth daily.      Fexofenadine HCl (ALLEGRA PO) Take 1 tablet by mouth daily as needed. For allergy symptoms.        Significant Diagnostic Studies:  CT of the brain 1. Acute right thalamic hemorrhage with adjacent vasogenic edema. More confluent hypodensity anterior to this hemorrhage is also probably from vasogenic edema but could be due to an adjacent age indeterminate lacunar infarct.  MRI of the brain Small right thalamic hemorrhage without intraventricular extension or underlying masses noted.  MRA of the brain No large vessel stenosis noted  2D Echocardiogram normal ejection fraction without cardiac source of embolism  Carotid Doppler preliminary results suggest no significant extracranial stenosis  CXR Not ordered  EKG normal sinus rhythm  Laboratory Studies CBC    Component Value Date/Time   WBC 10.7*  11/01/2011 1241   RBC 4.67 11/01/2011 1241   HGB 14.3 11/01/2011 1249   HCT 42.0 11/01/2011 1249   PLT 286 11/01/2011 1241   MCV 87.8 11/01/2011 1241   MCH 30.4 11/01/2011 1241   MCHC 34.6 11/01/2011 1241   RDW 12.6 11/01/2011 1241   LYMPHSABS 2.3 11/01/2011 1241   MONOABS 0.9 11/01/2011 1241   EOSABS 0.1 11/01/2011 1241   BASOSABS 0.0 11/01/2011 1241   CMP    Component Value Date/Time   NA 143 11/01/2011 1249   K 3.4* 11/01/2011 1249   CL 104 11/01/2011 1249   CO2 27 11/01/2011 1241   GLUCOSE 129* 11/01/2011 1249   BUN 12 11/01/2011 1249   CREATININE 0.70 11/01/2011 1249   CALCIUM 9.7 11/01/2011 1241   PROT 8.0 11/01/2011 1241   ALBUMIN 4.0 11/01/2011 1241   AST 31 11/01/2011 1241   ALT 37* 11/01/2011 1241   ALKPHOS 89 11/01/2011 1241   BILITOT 0.3 11/01/2011 1241   GFRNONAA >90 11/01/2011 1241   GFRAA >90 11/01/2011 1241   COAGS Lab Results  Component Value Date   INR 0.92 11/01/2011   Lipid Panel    Component Value Date/Time   CHOL 223* 11/02/2011 0530   TRIG 249* 11/02/2011 0530   HDL 41 11/02/2011 0530   CHOLHDL 5.4 11/02/2011 0530   VLDL 50* 11/02/2011 0530   LDLCALC 132* 11/02/2011 0530   HgbA1C  Lab Results  Component Value Date   HGBA1C 6.8* 11/02/2011  Urine Drug Screen  No results found for this basename: labopia, cocainscrnur, labbenz, amphetmu, thcu, labbarb    Alcohol Level No results found for this basename: eth    History of Present Illness  Carol Alvarez is an 61 y.o. female who was in church singing, then stepped off the stage and developed left sided weakness with resultant fall to the floor. Brought in as CVA code with NIHSS of 7. CT head showed small right subcortical hemorrhage, so no t-PA was given. 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 BP was 196/102. She was started on a Cardene drip and was admitted to the neuro ICU for further evaluation and management.    Hospital  Course: Carol Alvarez hemorrhage remained stable, with no increase during her hospitalization. Hemorrhage was secondary to hypertension. She knew she had hypertension, but refused to take medications. She was started on lisinopril and quickly weaned off the cardene. She was also found to have dyslipidemia and was started on zocor. HgbA1c is 6.8 - will defer treatment to primary care MD at discharge. Discussed with patient need to obtain primary MD.  PT evaluated. They initially recommended inpatient rehab. She was too high level for IP rehab and desired discharge home. Patient was deemed to be safe with her family at home and outpatient PT & OT follow up. Arrangements were put in place.  Patient has stroke risk factors of diabetes mellitus, hyperlipidemia, hypertension and noncompliance with medical regimen  Discharge Exam  Blood pressure 125/80, pulse 72, temperature 98.8 F (37.1 C), temperature source Oral, resp. rate 18, height 5\' 2"  (1.575 m), weight 54.7 kg (120 lb 9.5 oz), SpO2 97.00%.  Physical Exam  Pleasant middle-aged [place] lady not in distress. Afebrile. Head is nontraumatic. Neck is supple without bruit. Cardiac exam no murmur or gallop. Lungs clear to auscultation. Abdomen soft nontender.  Neurological exam patient's limited English and absence of any translator limits detailed cognitive testing. She follows one and two-step commands well. No dysarthria or aphasia. Eye movements are full range without nystagmus. She blinks to threat bilaterally. There is mild left lower facial symmetry. Tongue is midline.  Motor system exam reveals minimum left lower extremity drift. Mild left grip and ankle dorsiflexor weakness. Diminished fine finger movements on the left. Orbits right over Left upper extremity. Mild decreased left hemisensory loss. Coordination is slow but accurate on the left. Deep tendon reflexes are symmetric. Plantars are both downgoing. Gait was not tested  Discharge Diet   Carb  Control thin liquids  Discharge Plan   - Disposition:  Home with family - outpatient PT and OT - Ongoing risk factor control by Primary Care Physician. - Risk factor recommendations:  Hypertension target range 130-140/70-80 Lipid range - LDL < 100 and checked every 6 months, fasting Diabetes - HgB A1C <7  specifically address diabetes treatment - Follow-up Primary Care MD in 1 month. She has been instructed to get one - Follow-up with Dr. Delia Heady in 2 months.  Signed Annie Main, AVNP, ANP-BC, [facility] Nurse Practitioner 11/05/2011, 3:56 PM  Dr. Delia Heady, Stroke Center Medical Director, has personally reviewed chart, pertinent data, examined the patient and developed the plan of care.

## 2011-11-05 NOTE — Progress Notes (Signed)
Patient is appropriate for outpatient therapy at this time. Please call for any questions. Pager [phone].

## 2011-11-05 NOTE — Progress Notes (Signed)
Agree with revised goals.  11/05/2011 Cephus Shelling, PT, DPT [phone]

## 2011-11-05 NOTE — Progress Notes (Signed)
Pt given D/C instructions with Rx's, pt verbalized understanding. Pt D/C'd home via wheelchair with family @ 1730 per MD order. Pt given lists of choices for outpatient therapy. Rema Fendt, RN

## 2011-11-05 NOTE — Progress Notes (Signed)
Clinical Social Worker received consult for "SNF." CIR and PT/OT are recommending outpatient PT at this time. CSW met with pt to address consult, introduced herself and explained role of clinical social work. Pt declined SNF. Pt lives with husband and son. Pt shared that her husband will be providing care when she returns home. Pt also has the support of her church. RNCM is aware of pt's discharge needs. CSW is signing off as no further clinical social work needs identified. Please reconsult if a need arises prior to discharge.   Carol Alvarez, LCSWA [phone]

## 2011-11-05 NOTE — Progress Notes (Signed)
Physical Therapy Treatment Patient Details Name: Carol Alvarez MRN: [id number] DOB: 04/12/1951 Today's Date: 11/05/2011  PT Assessment/Plan  PT - Assessment/Plan PT Plan: Discharge plan remains appropriate PT Frequency: Min 4X/week Follow Up Recommendations: Outpatient PT Equipment Recommended: None recommended by PT;Other (comment) (Has RW at home) PT Goals  Acute Rehab PT Goals PT Goal: Rolling Supine to Right Side - Progress: Met Pt will go Supine/Side to Sit: Independently PT Goal: Supine/Side to Sit - Progress: Revised (modified due to lack of progress/goal met) PT Goal: Sit at Edge Of Bed - Progress: Met Pt will go Sit to Supine/Side: Independently PT Goal: Sit to Supine/Side - Progress: Revised (modified due to lack of progress/goal met) Pt will go Sit to Stand: Independently PT Goal: Sit to Stand - Progress: Updated due to goal met Pt will go Stand to Sit: Independently PT Goal: Stand to Sit - Progress: Updated due to goals met Pt will Ambulate: with modified independence;with least restrictive assistive device PT Goal: Ambulate - Progress: Revised (modified due to lack of progress/goal met)  PT Treatment Precautions/Restrictions  Precautions Precautions: Fall Required Braces or Orthoses: No Restrictions Weight Bearing Restrictions: No Mobility (including Balance) Bed Mobility Rolling Right: 6: Modified independent (Device/Increase time) Rolling Left: 6: Modified independent (Device/Increase time) Right Sidelying to Sit: 6: Modified independent (Device/Increase time);HOB flat Sitting - Scoot to Edge of Bed: 6: Modified independent (Device/Increase time) Sit to Supine - Right: 6: Modified independent (Device/Increase time);HOB flat Transfers Sit to Stand: 5: Supervision;From bed;From chair/3-in-1;With upper extremity assist;With armrests Sit to Stand Details (indicate cue type and reason): cues for hand placement Stand to Sit: 5: Supervision;To bed;To chair/3-in-1;With  upper extremity assist;With armrests Stand to Sit Details: cues for hand placement & controlled descent Ambulation/Gait Ambulation/Gait Assistance: 5: Supervision Ambulation/Gait Assistance Details (indicate cue type and reason): (S) for safety; cues to stay in center of RW secondary to pt tends to stay towards left side of RW & occasionally kicks back Left leg of RW with left foot.  LLE weakness/fatigue noted as distance increased- Educated pt on awareness of increased fatigue/weakness with LLE as distance/activity increases.   Ambulation Distance (Feet): 300 Feet Assistive device: Rolling walker Gait Pattern: Step-through pattern;Decreased step length - left (decreased step height; As distance increased. ) Stairs: No Wheelchair Mobility Wheelchair Mobility: No  High Level Balance High Level Balance Activites: Direction changes;Turns;Head turns;Sudden stops; tandem stance, single leg stance, external challenges given at hips/shoulders in all directions- pt with difficulty maintaining static standing High Level Balance Comments: No LOB noted but did have tendancy to veer off straight path.   Exercise    End of Session PT - End of Session Equipment Utilized During Treatment: Gait belt Activity Tolerance: Patient tolerated treatment well Patient left: in chair;with call bell in reach General Behavior During Session: [facility] for tasks performed Cognition: [facility] for tasks performed  Lara Mulch 11/05/2011, 3:00 PM [phone]

## 2011-11-05 NOTE — Progress Notes (Signed)
Occupational Therapy Treatment Patient Details Name: Carol Alvarez MRN: [id number] DOB: August 23, 1951 Today's Date: 11/05/2011  OT Assessment/Plan OT Assessment/Plan Follow Up Recommendations: Outpatient OT Equipment Recommended: None OT Goals Arm Goals Arm Goal: AROM - Progress: Progressing toward goal Arm Goal: Theraputty Exercises - Progress: Progressing toward goal Arm Goal: Additional Goal #1 - Progress: Progressing toward goals  OT Treatment Precautions/Restrictions  Precautions Precautions: Fall   Exercises Comments: Educated patient on and provided handouts on UE fine motor exercises (Hand exercises and theraputty exercises with Med-Soft theraputty). Pt. performed each exercise and verbalized understanding. Required Min VC to maximize use of LUE with each exercise. Also, performed LUE AROM exercises for shoulder distally. Patient declined out of chair at this time.  End of Session OT - End of Session Activity Tolerance: Patient tolerated treatment well Patient left: in chair;with call bell in reach General Behavior During Session: [facility] for tasks performed Cognition: [facility] for tasks performed  Carol Alvarez  11/05/2011, 3:18 PM

## 2011-11-06 NOTE — Progress Notes (Signed)
Patient was discharged prior to Case Manager seeing. Called husband and explained that they will be contacted regarding appointment date and time. [facility] [address]

## 2016-06-25 ENCOUNTER — Encounter: Payer: Self-pay | Admitting: Family Medicine

## 2016-06-25 ENCOUNTER — Ambulatory Visit (INDEPENDENT_AMBULATORY_CARE_PROVIDER_SITE_OTHER): Payer: Medicare HMO | Admitting: Family Medicine

## 2016-06-25 ENCOUNTER — Telehealth: Payer: Self-pay

## 2016-06-25 VITALS — BP 118/74 | HR 99 | Temp 98.1°F | Resp 17 | Ht 62.0 in | Wt 120.0 lb

## 2016-06-25 DIAGNOSIS — I10 Essential (primary) hypertension: Secondary | ICD-10-CM | POA: Diagnosis not present

## 2016-06-25 DIAGNOSIS — J4521 Mild intermittent asthma with (acute) exacerbation: Secondary | ICD-10-CM

## 2016-06-25 DIAGNOSIS — R062 Wheezing: Secondary | ICD-10-CM

## 2016-06-25 DIAGNOSIS — J069 Acute upper respiratory infection, unspecified: Secondary | ICD-10-CM | POA: Diagnosis not present

## 2016-06-25 MED ORDER — ALBUTEROL SULFATE HFA 108 (90 BASE) MCG/ACT IN AERS: 2.0000 | INHALATION_SPRAY | Freq: Four times a day (QID) | RESPIRATORY_TRACT | 0 refills | Status: AC | PRN

## 2016-06-25 MED ORDER — LISINOPRIL 5 MG PO TABS: 5.0000 mg | ORAL_TABLET | Freq: Every day | ORAL | 2 refills | Status: DC

## 2016-06-25 MED ORDER — LISINOPRIL 5 MG PO TABS: 5.0000 mg | ORAL_TABLET | Freq: Every day | ORAL | 1 refills | Status: DC

## 2016-06-25 MED ORDER — CETIRIZINE HCL 10 MG PO TABS: 10.0000 mg | ORAL_TABLET | Freq: Every day | ORAL | 11 refills | Status: AC

## 2016-06-25 MED ORDER — METHYLPREDNISOLONE SODIUM SUCC 125 MG IJ SOLR
125.0000 mg | Freq: Once | INTRAMUSCULAR | Status: AC
  Administered 2016-06-25: 125 mg via INTRAMUSCULAR

## 2016-06-25 MED ORDER — GUAIFENESIN-CODEINE 200-10 MG/5ML PO LIQD: 5.0000 mL | Freq: Three times a day (TID) | ORAL | 0 refills | Status: AC | PRN

## 2016-06-25 MED ORDER — SODIUM CHLORIDE 0.9 % IV SOLN: 60.0000 mg | Freq: Once | INTRAVENOUS | Status: DC

## 2016-06-25 NOTE — Telephone Encounter (Signed)
Called pharmacy and clarified prescription.

## 2016-06-25 NOTE — Telephone Encounter (Signed)
Pt has just left in our clinic by Dr. Gwendolyn GrantWalden and the pharmacist is calling in reference to Guaifenesin-Codeine 200-10 MG/5ML LIQD [[id number]][[id number]]. He can only dispense this in 100-10mg /875ml. He would also like to know how much to put in the bottle. Please advise at  [phone]

## 2016-06-25 NOTE — Addendum Note (Signed)
Addended by: Tobey GrimWALDEN, Britney Newstrom H on: 06/25/2016 09:38 AM   Modules accepted: Orders

## 2016-06-25 NOTE — Progress Notes (Signed)
Carol Alvarez is a 65 y.o. female who presents to Urgent Care today for URI  1.  SUBJECTIVE: URI symptoms:  Carol Alvarez is a 65 y.o. female who complains of URI symptoms present for past 3 days.  Describes rhinorrhea, sinus congestion, mild cough.  Has tried Mucinex without relief.  Sick contacts are none.  No fevers or chills. No nausea or vomiting.  Denies smoking cigarettes.  She has loratidine on her medication list but hasn't actually taken this and states she doesn't have this at home.   PMH:  No history of heart failure.  Does have history of asthma but has no inhaler at home.    OBJECTIVE: BP 118/74 (BP Location: Left Arm, Patient Position: Sitting, Cuff Size: Large)   Pulse 99   Temp 98.1 F (36.7 C) (Oral)   Resp 17   Ht 5\' 2"  (1.575 m)   Wt 120 lb (54.4 kg)   SpO2 97%   BMI 21.95 kg/m  Gen:  Patient sitting on exam table, appears stated age in no acute distress Head: Normocephalic atraumatic Eyes: EOMI, PERRL, sclera and conjunctiva non-erythematous Ears:  Canals clear bilaterally.  TMs pearly gray bilaterally without erythema or bulging.   Nose:  Nasal turbinates somewhat erythematous without exudate Mouth: Mucosa membranes moist. Tonsils +2, nonenlarged, non-erythematous.  Throat and pharynx normal appearing. Neck: No cervical lymphadenopathy noted Heart:  RRR, no murmurs auscultated. Pulm:  Some wheezing in BL bases.    Imp/Plan: 1.  URI: Likely viral illness based on symptoms and history.  No signs of bacterial illness. Symptomatic treatment for now, see instructions. Return if worsening or no improvement in 1 week.  Has been out of her lisinopril for about 2 weeks.  Has never had cough with this. Do not think this is contributing to her symptoms today because she has been out of it.   2.  Asthma exacerbation:  - Refill for inhaler.  - depomedrol here, 125 mg.   - triggered by recent URI  3.  HTN: - controlled today, despite being out of her medicine. - has had  head bleed from HTN urgency in past.  FU with PCP in next 2-3 weeks to recheck BP on her medications.

## 2016-06-25 NOTE — Patient Instructions (Addendum)
You have a cold.    I have sent in a cough syrup to help with your cough.  This may make you sleepy, do not take it during the day if it does.  Try the Cetirizine for relief of your runny nose.  You can continue taking this to help with itching as well once you are over your cold.   It was good to see you today!    IF you received an x-ray today, you will receive an invoice from [facility]. Please contact [facility] at [phone] with questions or concerns regarding your invoice.   IF you received labwork today, you will receive an invoice from [facility]. Please contact [facility] at [phone] with questions or concerns regarding your invoice.   Our billing staff will not be able to assist you with questions regarding bills from these companies.  You will be contacted with the lab results as soon as they are available. The fastest way to get your results is to activate your My Chart account. Instructions are located on the last page of this paperwork. If you have not heard from us regarding the results in 2 weeks, please contact this office.     We recommend that you schedule a mammogram for breast cancer screening. Typically, you do not need a referral to do this. Please contact a local imaging center to schedule your mammogram.  [facility] - [phone]  *ask for the Radiology Department [facility] - [phone] or [phone]  [facility] - [phone] [facility] - [phone] [facility] - [phone]  *ask for the Radiology Department [facility] - [phone]  *ask for the Radiology Department [facility] - [phone]  *ask for the Mammography Department [facility] - [phone]

## 2016-09-10 ENCOUNTER — Other Ambulatory Visit: Payer: Self-pay | Admitting: Family Medicine

## 2016-12-01 DIAGNOSIS — Z23 Encounter for immunization: Secondary | ICD-10-CM | POA: Diagnosis not present

## 2016-12-01 DIAGNOSIS — Z1211 Encounter for screening for malignant neoplasm of colon: Secondary | ICD-10-CM | POA: Diagnosis not present

## 2016-12-01 DIAGNOSIS — J00 Acute nasopharyngitis [common cold]: Secondary | ICD-10-CM | POA: Diagnosis not present

## 2016-12-01 DIAGNOSIS — Z1159 Encounter for screening for other viral diseases: Secondary | ICD-10-CM | POA: Diagnosis not present

## 2016-12-01 DIAGNOSIS — E1142 Type 2 diabetes mellitus with diabetic polyneuropathy: Secondary | ICD-10-CM | POA: Diagnosis not present

## 2016-12-01 DIAGNOSIS — I10 Essential (primary) hypertension: Secondary | ICD-10-CM | POA: Diagnosis not present

## 2016-12-01 DIAGNOSIS — Z1231 Encounter for screening mammogram for malignant neoplasm of breast: Secondary | ICD-10-CM | POA: Diagnosis not present

## 2016-12-01 DIAGNOSIS — Z7689 Persons encountering health services in other specified circumstances: Secondary | ICD-10-CM | POA: Diagnosis not present

## 2016-12-01 DIAGNOSIS — E785 Hyperlipidemia, unspecified: Secondary | ICD-10-CM | POA: Diagnosis not present

## 2016-12-04 ENCOUNTER — Other Ambulatory Visit: Payer: Self-pay | Admitting: Family

## 2016-12-04 DIAGNOSIS — Z1231 Encounter for screening mammogram for malignant neoplasm of breast: Secondary | ICD-10-CM

## 2016-12-05 ENCOUNTER — Other Ambulatory Visit: Payer: Self-pay | Admitting: Family Medicine

## 2016-12-16 DIAGNOSIS — E119 Type 2 diabetes mellitus without complications: Secondary | ICD-10-CM | POA: Diagnosis not present

## 2016-12-16 DIAGNOSIS — Z1211 Encounter for screening for malignant neoplasm of colon: Secondary | ICD-10-CM | POA: Diagnosis not present

## 2016-12-16 DIAGNOSIS — I10 Essential (primary) hypertension: Secondary | ICD-10-CM | POA: Diagnosis not present

## 2016-12-16 DIAGNOSIS — Z1231 Encounter for screening mammogram for malignant neoplasm of breast: Secondary | ICD-10-CM | POA: Diagnosis not present

## 2016-12-16 DIAGNOSIS — Z7189 Other specified counseling: Secondary | ICD-10-CM | POA: Diagnosis not present

## 2016-12-16 DIAGNOSIS — L853 Xerosis cutis: Secondary | ICD-10-CM | POA: Diagnosis not present

## 2016-12-16 DIAGNOSIS — R748 Abnormal levels of other serum enzymes: Secondary | ICD-10-CM | POA: Diagnosis not present

## 2017-01-13 DIAGNOSIS — H25812 Combined forms of age-related cataract, left eye: Secondary | ICD-10-CM | POA: Diagnosis not present

## 2017-01-13 DIAGNOSIS — H25811 Combined forms of age-related cataract, right eye: Secondary | ICD-10-CM | POA: Diagnosis not present

## 2017-01-13 DIAGNOSIS — H02831 Dermatochalasis of right upper eyelid: Secondary | ICD-10-CM | POA: Diagnosis not present

## 2017-01-21 DIAGNOSIS — H25812 Combined forms of age-related cataract, left eye: Secondary | ICD-10-CM | POA: Diagnosis not present

## 2017-01-21 DIAGNOSIS — H2512 Age-related nuclear cataract, left eye: Secondary | ICD-10-CM | POA: Diagnosis not present

## 2017-01-28 DIAGNOSIS — H25811 Combined forms of age-related cataract, right eye: Secondary | ICD-10-CM | POA: Diagnosis not present

## 2017-01-28 DIAGNOSIS — H2511 Age-related nuclear cataract, right eye: Secondary | ICD-10-CM | POA: Diagnosis not present

## 2017-03-15 DIAGNOSIS — E1129 Type 2 diabetes mellitus with other diabetic kidney complication: Secondary | ICD-10-CM | POA: Diagnosis not present

## 2017-03-15 DIAGNOSIS — R809 Proteinuria, unspecified: Secondary | ICD-10-CM | POA: Diagnosis not present

## 2017-03-15 DIAGNOSIS — E785 Hyperlipidemia, unspecified: Secondary | ICD-10-CM | POA: Diagnosis not present

## 2017-03-15 DIAGNOSIS — Z1231 Encounter for screening mammogram for malignant neoplasm of breast: Secondary | ICD-10-CM | POA: Diagnosis not present

## 2017-03-15 DIAGNOSIS — Z1211 Encounter for screening for malignant neoplasm of colon: Secondary | ICD-10-CM | POA: Diagnosis not present

## 2017-03-15 DIAGNOSIS — I10 Essential (primary) hypertension: Secondary | ICD-10-CM | POA: Diagnosis not present

## 2017-03-18 DIAGNOSIS — E1129 Type 2 diabetes mellitus with other diabetic kidney complication: Secondary | ICD-10-CM | POA: Diagnosis not present

## 2017-03-18 DIAGNOSIS — Z79899 Other long term (current) drug therapy: Secondary | ICD-10-CM | POA: Diagnosis not present

## 2017-03-18 DIAGNOSIS — Z7189 Other specified counseling: Secondary | ICD-10-CM | POA: Diagnosis not present

## 2017-03-18 DIAGNOSIS — R809 Proteinuria, unspecified: Secondary | ICD-10-CM | POA: Diagnosis not present

## 2017-06-17 ENCOUNTER — Other Ambulatory Visit: Payer: Self-pay | Admitting: Family Medicine

## 2017-06-21 DIAGNOSIS — Z1211 Encounter for screening for malignant neoplasm of colon: Secondary | ICD-10-CM | POA: Diagnosis not present

## 2017-06-21 DIAGNOSIS — Z1231 Encounter for screening mammogram for malignant neoplasm of breast: Secondary | ICD-10-CM | POA: Diagnosis not present

## 2017-06-21 DIAGNOSIS — R809 Proteinuria, unspecified: Secondary | ICD-10-CM | POA: Diagnosis not present

## 2017-06-21 DIAGNOSIS — E1129 Type 2 diabetes mellitus with other diabetic kidney complication: Secondary | ICD-10-CM | POA: Diagnosis not present

## 2017-06-21 DIAGNOSIS — M722 Plantar fascial fibromatosis: Secondary | ICD-10-CM | POA: Diagnosis not present

## 2017-06-21 DIAGNOSIS — E785 Hyperlipidemia, unspecified: Secondary | ICD-10-CM | POA: Diagnosis not present

## 2017-06-21 DIAGNOSIS — I10 Essential (primary) hypertension: Secondary | ICD-10-CM | POA: Diagnosis not present

## 2017-09-28 DIAGNOSIS — I10 Essential (primary) hypertension: Secondary | ICD-10-CM | POA: Diagnosis not present

## 2017-09-28 DIAGNOSIS — M25562 Pain in left knee: Secondary | ICD-10-CM | POA: Diagnosis not present

## 2017-09-28 DIAGNOSIS — G8929 Other chronic pain: Secondary | ICD-10-CM | POA: Diagnosis not present

## 2017-09-28 DIAGNOSIS — R809 Proteinuria, unspecified: Secondary | ICD-10-CM | POA: Diagnosis not present

## 2017-09-28 DIAGNOSIS — E785 Hyperlipidemia, unspecified: Secondary | ICD-10-CM | POA: Diagnosis not present

## 2017-09-28 DIAGNOSIS — E1129 Type 2 diabetes mellitus with other diabetic kidney complication: Secondary | ICD-10-CM | POA: Diagnosis not present

## 2020-12-26 DIAGNOSIS — H5203 Hypermetropia, bilateral: Secondary | ICD-10-CM | POA: Diagnosis not present

## 2021-01-27 DIAGNOSIS — R809 Proteinuria, unspecified: Secondary | ICD-10-CM | POA: Diagnosis not present

## 2021-01-27 DIAGNOSIS — I10 Essential (primary) hypertension: Secondary | ICD-10-CM | POA: Diagnosis not present

## 2021-01-27 DIAGNOSIS — L2084 Intrinsic (allergic) eczema: Secondary | ICD-10-CM | POA: Diagnosis not present

## 2021-01-27 DIAGNOSIS — E785 Hyperlipidemia, unspecified: Secondary | ICD-10-CM | POA: Diagnosis not present

## 2021-01-27 DIAGNOSIS — E1129 Type 2 diabetes mellitus with other diabetic kidney complication: Secondary | ICD-10-CM | POA: Diagnosis not present

## 2021-08-26 DIAGNOSIS — E785 Hyperlipidemia, unspecified: Secondary | ICD-10-CM | POA: Diagnosis not present

## 2021-08-26 DIAGNOSIS — E1129 Type 2 diabetes mellitus with other diabetic kidney complication: Secondary | ICD-10-CM | POA: Diagnosis not present

## 2021-08-26 DIAGNOSIS — I10 Essential (primary) hypertension: Secondary | ICD-10-CM | POA: Diagnosis not present

## 2021-08-26 DIAGNOSIS — R809 Proteinuria, unspecified: Secondary | ICD-10-CM | POA: Diagnosis not present

## 2021-08-26 DIAGNOSIS — Z0000 Encounter for general adult medical examination without abnormal findings: Secondary | ICD-10-CM | POA: Diagnosis not present

## 2021-08-26 DIAGNOSIS — Z23 Encounter for immunization: Secondary | ICD-10-CM | POA: Diagnosis not present

## 2022-02-24 DIAGNOSIS — E1159 Type 2 diabetes mellitus with other circulatory complications: Secondary | ICD-10-CM | POA: Diagnosis not present

## 2022-02-24 DIAGNOSIS — Z1211 Encounter for screening for malignant neoplasm of colon: Secondary | ICD-10-CM | POA: Diagnosis not present

## 2022-02-24 DIAGNOSIS — N3941 Urge incontinence: Secondary | ICD-10-CM | POA: Diagnosis not present

## 2022-02-24 DIAGNOSIS — R809 Proteinuria, unspecified: Secondary | ICD-10-CM | POA: Diagnosis not present

## 2022-02-24 DIAGNOSIS — E785 Hyperlipidemia, unspecified: Secondary | ICD-10-CM | POA: Diagnosis not present

## 2022-02-24 DIAGNOSIS — L2084 Intrinsic (allergic) eczema: Secondary | ICD-10-CM | POA: Diagnosis not present

## 2022-02-24 DIAGNOSIS — R195 Other fecal abnormalities: Secondary | ICD-10-CM | POA: Diagnosis not present

## 2022-02-24 DIAGNOSIS — E1129 Type 2 diabetes mellitus with other diabetic kidney complication: Secondary | ICD-10-CM | POA: Diagnosis not present

## 2022-02-24 DIAGNOSIS — R399 Unspecified symptoms and signs involving the genitourinary system: Secondary | ICD-10-CM | POA: Diagnosis not present

## 2022-02-24 DIAGNOSIS — R634 Abnormal weight loss: Secondary | ICD-10-CM | POA: Diagnosis not present

## 2022-02-24 DIAGNOSIS — I152 Hypertension secondary to endocrine disorders: Secondary | ICD-10-CM | POA: Diagnosis not present

## 2022-12-24 DIAGNOSIS — H04203 Unspecified epiphora, bilateral lacrimal glands: Secondary | ICD-10-CM | POA: Diagnosis not present

## 2023-01-27 DIAGNOSIS — Z0000 Encounter for general adult medical examination without abnormal findings: Secondary | ICD-10-CM | POA: Diagnosis not present

## 2023-01-27 DIAGNOSIS — N3941 Urge incontinence: Secondary | ICD-10-CM | POA: Diagnosis not present

## 2023-01-27 DIAGNOSIS — E1129 Type 2 diabetes mellitus with other diabetic kidney complication: Secondary | ICD-10-CM | POA: Diagnosis not present

## 2023-01-27 DIAGNOSIS — Z5320 Procedure and treatment not carried out because of patient's decision for unspecified reasons: Secondary | ICD-10-CM | POA: Diagnosis not present

## 2023-01-27 DIAGNOSIS — E1159 Type 2 diabetes mellitus with other circulatory complications: Secondary | ICD-10-CM | POA: Diagnosis not present

## 2023-01-27 DIAGNOSIS — R809 Proteinuria, unspecified: Secondary | ICD-10-CM | POA: Diagnosis not present

## 2023-08-02 DIAGNOSIS — R531 Weakness: Secondary | ICD-10-CM | POA: Diagnosis not present

## 2023-08-02 DIAGNOSIS — E785 Hyperlipidemia, unspecified: Secondary | ICD-10-CM | POA: Diagnosis not present

## 2023-08-02 DIAGNOSIS — R809 Proteinuria, unspecified: Secondary | ICD-10-CM | POA: Diagnosis not present

## 2023-08-02 DIAGNOSIS — R29898 Other symptoms and signs involving the musculoskeletal system: Secondary | ICD-10-CM | POA: Diagnosis not present

## 2023-08-02 DIAGNOSIS — E1129 Type 2 diabetes mellitus with other diabetic kidney complication: Secondary | ICD-10-CM | POA: Diagnosis not present

## 2023-08-19 DIAGNOSIS — M5136 Other intervertebral disc degeneration, lumbar region with discogenic back pain only: Secondary | ICD-10-CM | POA: Diagnosis not present

## 2023-08-19 DIAGNOSIS — M5126 Other intervertebral disc displacement, lumbar region: Secondary | ICD-10-CM | POA: Diagnosis not present

## 2023-08-19 DIAGNOSIS — M47816 Spondylosis without myelopathy or radiculopathy, lumbar region: Secondary | ICD-10-CM | POA: Diagnosis not present

## 2023-08-19 DIAGNOSIS — M5137 Other intervertebral disc degeneration, lumbosacral region with discogenic back pain only: Secondary | ICD-10-CM | POA: Diagnosis not present
